# Patient Record
Sex: Female | Born: 1995 | Hispanic: Yes | Smoking: Never smoker
Health system: Southern US, Community
[De-identification: ages and names within clinical notes are randomized; demographics above are authoritative.]

## PROBLEM LIST (undated history)

## (undated) ENCOUNTER — Inpatient Hospital Stay (HOSPITAL_COMMUNITY): Payer: Self-pay

## (undated) DIAGNOSIS — K831 Obstruction of bile duct: Secondary | ICD-10-CM

## (undated) DIAGNOSIS — O26619 Liver and biliary tract disorders in pregnancy, unspecified trimester: Secondary | ICD-10-CM

## (undated) DIAGNOSIS — B999 Unspecified infectious disease: Secondary | ICD-10-CM

## (undated) DIAGNOSIS — O24419 Gestational diabetes mellitus in pregnancy, unspecified control: Secondary | ICD-10-CM

## (undated) HISTORY — PX: NO PAST SURGERIES: SHX2092

## (undated) HISTORY — DX: Gestational diabetes mellitus in pregnancy, unspecified control: O24.419

---
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
## 2017-03-16 ENCOUNTER — Other Ambulatory Visit: Payer: Self-pay

## 2017-03-16 ENCOUNTER — Encounter (HOSPITAL_COMMUNITY): Payer: Self-pay | Admitting: Emergency Medicine

## 2017-03-16 ENCOUNTER — Emergency Department (HOSPITAL_COMMUNITY)
Admission: EM | Admit: 2017-03-16 | Discharge: 2017-03-16 | Disposition: A | Discharge status: Home / Self Care | Payer: Self-pay | Attending: Emergency Medicine | Admitting: Emergency Medicine

## 2017-03-16 DIAGNOSIS — N760 Acute vaginitis: Secondary | ICD-10-CM | POA: Insufficient documentation

## 2017-03-16 DIAGNOSIS — Z711 Person with feared health complaint in whom no diagnosis is made: Secondary | ICD-10-CM | POA: Insufficient documentation

## 2017-03-16 DIAGNOSIS — B9689 Other specified bacterial agents as the cause of diseases classified elsewhere: Secondary | ICD-10-CM | POA: Insufficient documentation

## 2017-03-16 DIAGNOSIS — R300 Dysuria: Secondary | ICD-10-CM | POA: Insufficient documentation

## 2017-03-16 DIAGNOSIS — Z87891 Personal history of nicotine dependence: Secondary | ICD-10-CM | POA: Insufficient documentation

## 2017-03-16 LAB — WET PREP, GENITAL
Sperm: NONE SEEN
Trich, Wet Prep: NONE SEEN
Yeast Wet Prep HPF POC: NONE SEEN

## 2017-03-16 LAB — URINALYSIS, ROUTINE W REFLEX MICROSCOPIC
Bilirubin Urine: NEGATIVE
GLUCOSE, UA: NEGATIVE mg/dL
KETONES UR: NEGATIVE mg/dL
Nitrite: NEGATIVE
PH: 7 (ref 5.0–8.0)
PROTEIN: 30 mg/dL — AB
Specific Gravity, Urine: 1.014 (ref 1.005–1.030)

## 2017-03-16 LAB — GC/CHLAMYDIA PROBE AMP (~~LOC~~) NOT AT ARMC
Chlamydia: NEGATIVE
NEISSERIA GONORRHEA: NEGATIVE

## 2017-03-16 MED ORDER — LIDOCAINE HCL (PF) 1 % IJ SOLN
INTRAMUSCULAR | Status: AC
  Filled 2017-03-16: qty 5

## 2017-03-16 MED ORDER — ONDANSETRON 4 MG PO TBDP
4.0000 mg | ORAL_TABLET | Freq: Once | ORAL | Status: AC
Start: 2017-03-16 — End: 2017-03-16
  Administered 2017-03-16: 4 mg via ORAL
  Filled 2017-03-16: qty 1

## 2017-03-16 MED ORDER — CEFTRIAXONE SODIUM 250 MG IJ SOLR
250.0000 mg | Freq: Once | INTRAMUSCULAR | Status: AC
  Administered 2017-03-16: 250 mg via INTRAMUSCULAR
  Filled 2017-03-16: qty 250

## 2017-03-16 MED ORDER — METRONIDAZOLE 500 MG PO TABS: 500.0000 mg | ORAL_TABLET | Freq: Two times a day (BID) | ORAL | 0 refills | Status: DC

## 2017-03-16 MED ORDER — CEPHALEXIN 500 MG PO CAPS: 500.0000 mg | ORAL_CAPSULE | Freq: Four times a day (QID) | ORAL | 0 refills | Status: DC

## 2017-03-16 MED ORDER — AZITHROMYCIN 250 MG PO TABS
1000.0000 mg | ORAL_TABLET | Freq: Once | ORAL | Status: AC
  Administered 2017-03-16: 1000 mg via ORAL
  Filled 2017-03-16: qty 4

## 2017-03-16 NOTE — ED Notes (Signed)
Pelvic cart set up at bedside  

## 2017-03-16 NOTE — ED Provider Notes (Signed)
[facility] EMERGENCY DEPARTMENT Provider Note   CSN: [id number] Arrival date & time: 03/16/17  0123     History   Chief Complaint Chief Complaint  Patient presents with  . Vaginal Itching    HPI Colleen Rose is a 22 y.o. female with a hx of gestational diabetes, cholestasis of pregnancy presents to the Emergency Department complaining of gradual, persistent, progressively worsening dysuria and vaginal burning onset yesterday after sexual intercourse.  Patient reports she was sexually active with her boyfriend which caused some pain but no bleeding.  She reports that afterwards she had some vaginal irritation but has had increased dysuria since that time.  She reports late last night, she developed low back pain and lower abdominal cramping.  Patient reports she is sexually active with one female partner.  She is not on birth control and is not using a condom.  She reports last menstrual cycle was at the end of January.  Nothing seems to make her symptoms better or worse.  No treatments prior to arrival.  She denies fever, chills, vomiting, diarrhea, weakness, dizziness, syncope.  Patient does endorse some associated vaginal discharge.  The history is provided by the patient and medical records. No language interpreter was used.    Past Medical History:  Diagnosis Date  . Gestational diabetes    diet controlled  . Medical history non-contributory     Patient Active Problem List   Diagnosis Date Noted  . Cholestasis of pregnancy 04/05/2015  . Pruritus of pregnancy 04/04/2015  . Maternal varicella, non-immune 03/25/2015  . Anemia affecting pregnancy 03/25/2015  . Supervision of high risk pregnancy, antepartum 03/08/2015  . High risk teen pregnancy 03/08/2015  . Gestational diabetes 03/08/2015    Past Surgical History:  Procedure Laterality Date  . NO PAST SURGERIES      OB History    Gravida Para Term Preterm AB Living   1 1 1     1    SAB TAB  Ectopic Multiple Live Births         0 1       Home Medications    Prior to Admission medications   Medication Sig Start Date End Date Taking? Authorizing Provider  cephALEXin (KEFLEX) 500 MG capsule Take 1 capsule (500 mg total) by mouth 4 (four) times daily. 03/16/17   Xiadani Damman, Dahlia Client, PA-C  ibuprofen (ADVIL,MOTRIN) 600 MG tablet Take 1 tablet (600 mg total) by mouth every 6 (six) hours. Patient not taking: Reported on 03/16/2017 04/08/15   Montez Morita, CNM  metroNIDAZOLE (FLAGYL) 500 MG tablet Take 1 tablet (500 mg total) by mouth 2 (two) times daily. 03/16/17   Debi Cousin, Dahlia Client, PA-C    Family History Family History  Problem Relation Age of Onset  . Hypertension Mother   . Diabetes Mother   . Hypertension Father   . Diabetes Father     Social History Social History   Tobacco Use  . Smoking status: Former Games developer  . Smokeless tobacco: Never Used  Substance Use Topics  . Alcohol use: No  . Drug use: Yes    Types: Marijuana    Comment:      before pregnancy      Allergies   Other   Review of Systems Review of Systems  Constitutional: Negative for appetite change, diaphoresis, fatigue, fever and unexpected weight change.  HENT: Negative for mouth sores.   Eyes: Negative for visual disturbance.  Respiratory: Negative for cough, chest tightness, shortness of breath  and wheezing.   Cardiovascular: Negative for chest pain.  Gastrointestinal: Positive for abdominal pain ( low). Negative for constipation, diarrhea, nausea and vomiting.  Endocrine: Negative for polydipsia, polyphagia and polyuria.  Genitourinary: Positive for dysuria, vaginal discharge and vaginal pain. Negative for frequency, hematuria and urgency.  Musculoskeletal: Positive for back pain ( low). Negative for neck stiffness.  Skin: Negative for rash.  Allergic/Immunologic: Negative for immunocompromised state.  Neurological: Negative for syncope, light-headedness and headaches.    Hematological: Does not bruise/bleed easily.  Psychiatric/Behavioral: Negative for sleep disturbance. The patient is not nervous/anxious.      Physical Exam Updated Vital Signs BP 122/79 (BP Location: Right Arm)   Pulse 91   Temp 98.4 F (36.9 C) (Oral)   Resp 18   Ht 5\' 1"  (1.549 m)   Wt 57.6 kg (127 lb)   LMP 03/03/2017   SpO2 100%   BMI 24.00 kg/m   Physical Exam  Constitutional: She appears well-developed and well-nourished. No distress.  HENT:  Head: Normocephalic and atraumatic.  Eyes: Conjunctivae are normal.  Neck: Normal range of motion.  Cardiovascular: Normal rate, regular rhythm, normal heart sounds and intact distal pulses.  No murmur heard. Pulmonary/Chest: Effort normal and breath sounds normal. No respiratory distress. She has no wheezes.  Abdominal: Soft. Bowel sounds are normal. There is tenderness in the suprapubic area. There is no rigidity, no rebound, no guarding and no CVA tenderness. Hernia confirmed negative in the right inguinal area and confirmed negative in the left inguinal area.  No CVA tenderness.  Genitourinary: Uterus normal. No labial fusion. There is no rash, tenderness or lesion on the right labia. There is no rash, tenderness or lesion on the left labia. Uterus is not deviated, not enlarged, not fixed and not tender. Cervix exhibits no motion tenderness, no discharge and no friability. Right adnexum displays no mass, no tenderness and no fullness. Left adnexum displays no mass, no tenderness and no fullness. No erythema, tenderness or bleeding in the vagina. No foreign body in the vagina. No signs of injury around the vagina. Vaginal discharge (thin, green, frothy) found.  Musculoskeletal: Normal range of motion. She exhibits no edema.  Lymphadenopathy:       Right: No inguinal adenopathy present.       Left: No inguinal adenopathy present.  Neurological: She is alert.  Skin: Skin is warm and dry. She is not diaphoretic. No erythema.   Psychiatric: She has a normal mood and affect.  Nursing note and vitals reviewed.    ED Treatments / Results  Labs (all labs ordered are listed, but only abnormal results are displayed) Labs Reviewed  WET PREP, GENITAL - Abnormal; Notable for the following components:      Result Value   Clue Cells Wet Prep HPF POC PRESENT (*)    WBC, Wet Prep HPF POC MANY (*)    All other components within normal limits  URINALYSIS, ROUTINE W REFLEX MICROSCOPIC - Abnormal; Notable for the following components:   APPearance HAZY (*)    Hgb urine dipstick MODERATE (*)    Protein, ur 30 (*)    Leukocytes, UA LARGE (*)    Bacteria, UA RARE (*)    Squamous Epithelial / LPF 0-5 (*)    All other components within normal limits  PREGNANCY, URINE  GC/CHLAMYDIA PROBE AMP ([place]) NOT AT [facility]    Procedures Procedures (including critical care time)  Medications Ordered in ED Medications  lidocaine (PF) (XYLOCAINE) 1 % injection (not administered)  cefTRIAXone (ROCEPHIN) injection 250 mg (250 mg Intramuscular Given 03/16/17 0723)  azithromycin (ZITHROMAX) tablet 1,000 mg (1,000 mg Oral Given 03/16/17 0723)  ondansetron (ZOFRAN-ODT) disintegrating tablet 4 mg (4 mg Oral Given 03/16/17 0723)     Initial Impression / Assessment and Plan / ED Course  I have reviewed the triage vital signs and the nursing notes.  Pertinent labs & imaging results that were available during my care of the patient were reviewed by me and considered in my medical decision making (see chart for details).  Clinical Course as of Mar 17 731  Tue Mar 16, 2017  0648 Also TNTC WBCs.  Pt with dysuria, will treat for possible UTI. Leukocytes, UA: (!) LARGE [HM]    Clinical Course User Index [HM] Susanna Benge, Boyd KerbsHannah, PA-C    Are ordered on patient presents with dysuria and vaginal irritation after sexual encounter with her boyfriend.  On pelvic exam, vaginal discharge is noted but no cervical motion or adnexal tenderness.   No evidence of PID.  Urinalysis with too numerous to count white blood cells and leukocytes.  Concern for possible urinary tract infection versus STD.  Patient treated with azithromycin and Rocephin.  Will be discharged home with prescription for Flagyl for bacterial vaginosis and Keflex for UTI.  Discussed the need to inform all partners if positive.  Patient will also need OB/GYN follow-up.  Patient states understanding and is in agreement with this plan.  Patient  Final Clinical Impressions(s) / ED Diagnoses   Final diagnoses:  BV (bacterial vaginosis)  Concern about STD in female without diagnosis  Dysuria    ED Discharge Orders        Ordered    metroNIDAZOLE (FLAGYL) 500 MG tablet  2 times daily     03/16/17 0728    cephALEXin (KEFLEX) 500 MG capsule  4 times daily     03/16/17 0728       Raylynn Hersh, Dahlia ClientHannah, PA-C 03/16/17 0737    Horton, Mayer Maskerourtney F, MD 03/16/17 [phone]

## 2017-03-16 NOTE — ED Triage Notes (Signed)
Pt states she is having burning sensation with urination and vaginal itchiness since yesterday. No fever or chills.

## 2017-03-16 NOTE — ED Notes (Signed)
Patient given Sprite and Graham crackers. 

## 2017-03-16 NOTE — Discharge Instructions (Signed)
1. Medications: Flagyl, Keflex, usual home medications 2. Treatment: rest, drink plenty of fluids, use a condom with every sexual encounter 3. Follow Up: Please followup with your primary doctor in 3 days for discussion of your diagnoses and further evaluation after today's visit; if you do not have a primary care doctor use the resource guide provided to find one; Please return to the ER for worsening symptoms, high fevers or persistent vomiting.  You have been tested for chlamydia and gonorrhea.  These results will be available in approximately 3 days.  Please inform all sexual partners if you test positive for any of these diseases.

## 2017-11-15 ENCOUNTER — Encounter: Payer: Self-pay | Admitting: Family Medicine

## 2017-11-15 ENCOUNTER — Ambulatory Visit (INDEPENDENT_AMBULATORY_CARE_PROVIDER_SITE_OTHER): Payer: Self-pay | Admitting: *Deleted

## 2017-11-15 ENCOUNTER — Encounter: Payer: Self-pay | Admitting: *Deleted

## 2017-11-15 VITALS — BP 125/71 | HR 102 | Wt 147.9 lb

## 2017-11-15 DIAGNOSIS — Z3201 Encounter for pregnancy test, result positive: Secondary | ICD-10-CM

## 2017-11-15 LAB — POCT PREGNANCY, URINE: PREG TEST UR: POSITIVE — AB

## 2017-11-15 NOTE — Progress Notes (Signed)
Agree with RN documentation and plan of care.  Sueko Dimichele S. Ashur Glatfelter, DO OB/GYN Fellow  

## 2017-11-15 NOTE — Progress Notes (Addendum)
Here to confirm pregnancy. Had positive upt at home. LMP 10/15/17. Denies any pain or bleeding presently. Just wanted to confirm pregnancy. Advised to start taking PNV. Currently takes Melatonin and Benadryl for sleep. Advised not to take Melatonin during first trimester and may take Tylenol for pain as needed. Will give pregnancy verification letter at check out. Advised to make appt to begin [facility] with provider of choice

## 2017-11-21 ENCOUNTER — Emergency Department (HOSPITAL_COMMUNITY)
Admission: EM | Admit: 2017-11-21 | Discharge: 2017-11-21 | Disposition: A | Discharge status: Home / Self Care | Payer: Self-pay | Attending: Emergency Medicine | Admitting: Emergency Medicine

## 2017-11-21 ENCOUNTER — Emergency Department (HOSPITAL_COMMUNITY): Payer: Self-pay

## 2017-11-21 ENCOUNTER — Encounter (HOSPITAL_COMMUNITY): Payer: Self-pay | Admitting: Emergency Medicine

## 2017-11-21 DIAGNOSIS — Z79899 Other long term (current) drug therapy: Secondary | ICD-10-CM | POA: Insufficient documentation

## 2017-11-21 DIAGNOSIS — Z3A00 Weeks of gestation of pregnancy not specified: Secondary | ICD-10-CM | POA: Insufficient documentation

## 2017-11-21 DIAGNOSIS — O4690 Antepartum hemorrhage, unspecified, unspecified trimester: Secondary | ICD-10-CM

## 2017-11-21 DIAGNOSIS — Z87891 Personal history of nicotine dependence: Secondary | ICD-10-CM | POA: Insufficient documentation

## 2017-11-21 DIAGNOSIS — O200 Threatened abortion: Secondary | ICD-10-CM | POA: Insufficient documentation

## 2017-11-21 LAB — BASIC METABOLIC PANEL
ANION GAP: 9 (ref 5–15)
BUN: 10 mg/dL (ref 6–20)
CHLORIDE: 108 mmol/L (ref 98–111)
CO2: 20 mmol/L — ABNORMAL LOW (ref 22–32)
Calcium: 9.1 mg/dL (ref 8.9–10.3)
Creatinine, Ser: 0.48 mg/dL (ref 0.44–1.00)
GFR calc Af Amer: >60 mL/min (ref >60)
GLUCOSE: 92 mg/dL (ref 70–99)
POTASSIUM: 4.5 mmol/L (ref 3.5–5.1)
SODIUM: 137 mmol/L (ref 135–145)

## 2017-11-21 LAB — CBC WITH DIFFERENTIAL/PLATELET
Abs Immature Granulocytes: 0.07 10*3/uL (ref 0.00–0.07)
BASOS ABS: 0.1 10*3/uL (ref 0.0–0.1)
Basophils Relative: 0 %
EOS ABS: 0.1 10*3/uL (ref 0.0–0.5)
EOS PCT: 1 %
HEMATOCRIT: 41.4 % (ref 36.0–46.0)
HEMOGLOBIN: 13.3 g/dL (ref 12.0–15.0)
Immature Granulocytes: 1 %
Lymphocytes Relative: 27 %
Lymphs Abs: 3.7 10*3/uL (ref 0.7–4.0)
MCH: 27.1 pg (ref 26.0–34.0)
MCHC: 32.1 g/dL (ref 30.0–36.0)
MCV: 84.5 fL (ref 80.0–100.0)
MONO ABS: 0.8 10*3/uL (ref 0.1–1.0)
Monocytes Relative: 5 %
NRBC: 0 % (ref 0.0–0.2)
Neutro Abs: 9.3 10*3/uL — ABNORMAL HIGH (ref 1.7–7.7)
Neutrophils Relative %: 66 %
Platelets: 462 10*3/uL — ABNORMAL HIGH (ref 150–400)
RBC: 4.9 MIL/uL (ref 3.87–5.11)
RDW: 12.3 % (ref 11.5–15.5)
WBC: 14.1 10*3/uL — ABNORMAL HIGH (ref 4.0–10.5)

## 2017-11-21 LAB — URINALYSIS, ROUTINE W REFLEX MICROSCOPIC
BILIRUBIN URINE: NEGATIVE
Glucose, UA: NEGATIVE mg/dL
KETONES UR: NEGATIVE mg/dL
NITRITE: NEGATIVE
PROTEIN: NEGATIVE mg/dL
Specific Gravity, Urine: 1.014 (ref 1.005–1.030)
pH: 6 (ref 5.0–8.0)

## 2017-11-21 LAB — WET PREP, GENITAL
SPERM: NONE SEEN
Trich, Wet Prep: NONE SEEN
YEAST WET PREP: NONE SEEN

## 2017-11-21 LAB — HCG, QUANTITATIVE, PREGNANCY: hCG, Beta Chain, Quant, S: 1580 m[IU]/mL — ABNORMAL HIGH (ref <5)

## 2017-11-21 NOTE — Discharge Instructions (Addendum)
Please have a repeat quantitative HCG blood test done in 2 days. This can be done at [facility] or in the [facility] Emergency Room.  Go to the emergency department at [facility] should you begin to have fever, increased pain, passing out, increased bleeding, or any other major concerns.

## 2017-11-21 NOTE — ED Provider Notes (Signed)
[facility] EMERGENCY DEPARTMENT Provider Note   CSN: [id number] Arrival date & time: 11/21/17  1443     History   Chief Complaint Chief Complaint  Patient presents with  . Vaginal Bleeding  . Threatened Miscarriage    HPI Colleen Rose is a 22 y.o. female.  HPI   Colleen Rose is a 22 y.o. female, with a history of G2P1, presenting to the ED with vaginal bleeding in setting of pregnancy. States she is [redacted] weeks pregnant.  She has irregular periods and this gestational age was estimated based on the time she last had vaginal bleeding that could have been her period, which occurred in October 15, 2017. Bleeding began with spotting yesterday.  It has increased to the point to where she states it is heavy enough to be her period.  She has used 2 pads in the last 24 hours. She also complains of intermittent lower abdominal and left lower quadrant cramping beginning yesterday.  Currently pain is 6/10, nonradiating. Patient states she was seen by [facility] on 10/14 and had a positive pregnancy test at that time.  Her next prenatal follow-up appointment is in December.  Denies fever/chills, nausea/vomiting, dysuria, back pain, or any other complaints.   Past Medical History:  Diagnosis Date  . Gestational diabetes    diet controlled  . Medical history non-contributory     Patient Active Problem List   Diagnosis Date Noted  . Cholestasis of pregnancy 04/05/2015  . Pruritus of pregnancy 04/04/2015  . Maternal varicella, non-immune 03/25/2015  . Anemia affecting pregnancy 03/25/2015  . Supervision of high risk pregnancy, antepartum 03/08/2015  . High risk teen pregnancy 03/08/2015  . Gestational diabetes 03/08/2015    Past Surgical History:  Procedure Laterality Date  . NO PAST SURGERIES       OB History    Gravida  2   Para  1   Term  1   Preterm      AB      Living  1     SAB      TAB      Ectopic        Multiple  0   Live Births  1            Home Medications    Prior to Admission medications   Medication Sig Start Date End Date Taking? Authorizing Provider  diphenhydrAMINE (BENADRYL) 25 MG tablet Take 25 mg by mouth every 6 (six) hours as needed.    [provider]  Melatonin 2.5 MG CHEW Chew by mouth.    [provider]    Family History Family History  Problem Relation Age of Onset  . Hypertension Mother   . Diabetes Mother   . Hypertension Father   . Diabetes Father     Social History Social History   Tobacco Use  . Smoking status: Former Games developer  . Smokeless tobacco: Never Used  Substance Use Topics  . Alcohol use: No  . Drug use: Yes    Types: Marijuana    Comment:      before pregnancy      Allergies   Other   Review of Systems Review of Systems  Constitutional: Negative for chills, diaphoresis and fever.  Respiratory: Negative for shortness of breath.   Cardiovascular: Negative for chest pain.  Gastrointestinal: Positive for abdominal pain. Negative for blood in stool, nausea and vomiting.  Genitourinary: Positive for vaginal bleeding. Negative for dysuria  and flank pain.  Musculoskeletal: Negative for back pain.  Neurological: Negative for dizziness, weakness and light-headedness.  All other systems reviewed and are negative.    Physical Exam Updated Vital Signs BP 117/67   Pulse 85   Temp 99.1 F (37.3 C)   Resp 18   LMP 10/15/2017   SpO2 100%   Physical Exam  Constitutional: She appears well-developed and well-nourished. No distress.  HENT:  Head: Normocephalic and atraumatic.  Eyes: Conjunctivae are normal.  Neck: Neck supple.  Cardiovascular: Normal rate, regular rhythm and intact distal pulses.  Pulmonary/Chest: Effort normal and breath sounds normal. No respiratory distress.  Abdominal: Soft. There is tenderness. There is no guarding.    Patient indicates tenderness verbally only and has no other  reaction.  Genitourinary:  Genitourinary Comments: External genitalia normal Vagina with discharge - Blood and mucus in the vaginal vault and appearing to originate from the cervical os. Cervix  abnormal - Cervical os appears to be open negative for cervical motion tenderness Adnexa palpated, no masses, positive for tenderness noted bilaterally Bladder palpated negative for tenderness Uterus palpated no masses, positive for tenderness  No inguinal lymphadenopathy. Otherwise normal female genitalia. RN, Albin Felling, served as chaperone during exam.  What appears to be tissue came out of patient's vagina.  She states she found it in her underwear while here in the ED. Its appearance would fit with products of conception. This was collected and sent to pathology.  Musculoskeletal: She exhibits no edema.  Lymphadenopathy:    She has no cervical adenopathy.  Neurological: She is alert.  Skin: Skin is warm and dry. She is not diaphoretic.  Psychiatric: She has a normal mood and affect. Her behavior is normal.  Nursing note and vitals reviewed.    ED Treatments / Results  Labs (all labs ordered are listed, but only abnormal results are displayed) Labs Reviewed  WET PREP, GENITAL - Abnormal; Notable for the following components:      Result Value   Clue Cells Wet Prep HPF POC PRESENT (*)    WBC, Wet Prep HPF POC FEW (*)    All other components within normal limits  CBC WITH DIFFERENTIAL/PLATELET - Abnormal; Notable for the following components:   WBC 14.1 (*)    Platelets 462 (*)    Neutro Abs 9.3 (*)    All other components within normal limits  HCG, QUANTITATIVE, PREGNANCY - Abnormal; Notable for the following components:   hCG, Beta Chain, Quant, S 1,580 (*)    All other components within normal limits  URINALYSIS, ROUTINE W REFLEX MICROSCOPIC - Abnormal; Notable for the following components:   Hgb urine dipstick LARGE (*)    Leukocytes, UA TRACE (*)    Bacteria, UA RARE (*)    All  other components within normal limits  BASIC METABOLIC PANEL - Abnormal; Notable for the following components:   CO2 20 (*)    All other components within normal limits  GC/CHLAMYDIA PROBE AMP ([place]) NOT AT [facility]  SURGICAL PATHOLOGY    EKG None  Radiology US Ob Comp < 14 Wks  Result Date: 11/21/2017 CLINICAL DATA:  Lower abdominal pain and vaginal bleeding. Elevated beta HCG. EXAM: OBSTETRIC <14 WK [place] AND TRANSVAGINAL OB US TECHNIQUE: Both transabdominal and transvaginal ultrasound examinations were performed for complete evaluation of the gestation as well as the maternal uterus, adnexal regions, and pelvic cul-de-sac. Transvaginal technique was performed to assess early pregnancy. COMPARISON:  Obstetric ultrasound 03/27/2015 FINDINGS: Intrauterine gestational sac: None  Yolk sac:  Not Visualized. Embryo:  Not Visualized. Cardiac Activity: Not Visualized. Maternal uterus/adnexae: Normal appearance of the ovaries. IMPRESSION: No evidence of intrauterine pregnancy. Clinical follow-up is recommended. Electronically Signed   By: Ted Mcalpine M.D.   On: 11/21/2017 18:39   US Ob Transvaginal  Result Date: 11/21/2017 CLINICAL DATA:  Lower abdominal pain and vaginal bleeding. Elevated beta HCG. EXAM: OBSTETRIC <14 WK [place] AND TRANSVAGINAL OB US TECHNIQUE: Both transabdominal and transvaginal ultrasound examinations were performed for complete evaluation of the gestation as well as the maternal uterus, adnexal regions, and pelvic cul-de-sac. Transvaginal technique was performed to assess early pregnancy. COMPARISON:  Obstetric ultrasound 03/27/2015 FINDINGS: Intrauterine gestational sac: None Yolk sac:  Not Visualized. Embryo:  Not Visualized. Cardiac Activity: Not Visualized. Maternal uterus/adnexae: Normal appearance of the ovaries. IMPRESSION: No evidence of intrauterine pregnancy. Clinical follow-up is recommended. Electronically Signed   By: Ted Mcalpine M.D.   On: 11/21/2017 18:39      Procedures Pelvic exam Date/Time: 11/21/2017 5:11 PM Performed by: Anselm Pancoast, PA-C Authorized by: Anselm Pancoast, PA-C  Consent: Verbal consent obtained. Risks and benefits: risks, benefits and alternatives were discussed Consent given by: patient Patient identity confirmed: verbally with patient and provided demographic data Local anesthesia used: no  Anesthesia: Local anesthesia used: no  Sedation: Patient sedated: no  Patient tolerance: Patient tolerated the procedure well with no immediate complications    (including critical care time)  Medications Ordered in ED Medications - No data to display   Initial Impression / Assessment and Plan / ED Course  I have reviewed the triage vital signs and the nursing notes.  Pertinent labs & imaging results that were available during my care of the patient were reviewed by me and considered in my medical decision making (see chart for details).     Patient presents with vaginal bleeding in the setting of pregnancy.  Patient is nontoxic appearing, afebrile, not tachycardic, not tachypneic, not hypotensive, maintains excellent SPO2 on room air, and is in no apparent distress. Concern for spontaneous miscarriage based on bleeding, cramping, suspected open cervical os, and presence of products of conception.  Patient was informed of this possibility.  She will follow-up with OB/GYN in 2 days for repeat testing.  Return precautions discussed.  Patient voices understanding of these instructions, accepts the plan, and is comfortable with discharge.    Vitals:   11/21/17 1845 11/21/17 1915 11/21/17 1930 11/21/17 1945  BP: (!) 95/53 (!) 110/55 103/67 117/66  Pulse: 96 86 93 98  Resp:      Temp:      TempSrc:      SpO2: 100% 100% 100% 100%     Final Clinical Impressions(s) / ED Diagnoses   Final diagnoses:  Vaginal bleeding in pregnancy    ED Discharge Orders    None       Concepcion Living 11/21/17 2021     Gwyneth Sprout, MD 11/21/17 2116

## 2017-11-21 NOTE — ED Notes (Signed)
[redacted] weeks pregnant.  G2P1, 1st pregnancy full term, vaginal delivery, no complications.  Onset yesterday vaginal bleeding, increased today, Changed one pad today that was 1/3 saturated, bright red blood.  Mild abd cramping, 3/10 pain scale.  Last intercourse 3-4 weeks ago.

## 2017-11-21 NOTE — ED Triage Notes (Signed)
Pt states she is [redacted] weeks pregnant. She has had spotting since yesterday but today the spotting increased.

## 2017-11-21 NOTE — ED Notes (Signed)
Pt called this nurse to [facility], pt passed what appears to be tissue.  PA made aware.

## 2017-11-22 LAB — GC/CHLAMYDIA PROBE AMP (~~LOC~~) NOT AT ARMC
Chlamydia: NEGATIVE
NEISSERIA GONORRHEA: NEGATIVE

## 2017-11-23 ENCOUNTER — Telehealth: Payer: Self-pay | Admitting: Family Medicine

## 2017-11-23 ENCOUNTER — Inpatient Hospital Stay (HOSPITAL_COMMUNITY)
Admission: AD | Admit: 2017-11-23 | Discharge: 2017-11-23 | Disposition: A | Discharge status: Home / Self Care | Payer: Self-pay | Source: Ambulatory Visit | Attending: Obstetrics and Gynecology | Admitting: Obstetrics and Gynecology

## 2017-11-23 ENCOUNTER — Other Ambulatory Visit: Payer: Self-pay

## 2017-11-23 ENCOUNTER — Encounter (HOSPITAL_COMMUNITY): Payer: Self-pay | Admitting: *Deleted

## 2017-11-23 DIAGNOSIS — O039 Complete or unspecified spontaneous abortion without complication: Secondary | ICD-10-CM

## 2017-11-23 DIAGNOSIS — O209 Hemorrhage in early pregnancy, unspecified: Secondary | ICD-10-CM | POA: Insufficient documentation

## 2017-11-23 HISTORY — DX: Liver and biliary tract disorders in pregnancy, unspecified trimester: O26.619

## 2017-11-23 HISTORY — DX: Unspecified infectious disease: B99.9

## 2017-11-23 HISTORY — DX: Obstruction of bile duct: K83.1

## 2017-11-23 LAB — CBC
HEMATOCRIT: 40.3 % (ref 36.0–46.0)
HEMOGLOBIN: 13.6 g/dL (ref 12.0–15.0)
MCH: 28.3 pg (ref 26.0–34.0)
MCHC: 33.7 g/dL (ref 30.0–36.0)
MCV: 84 fL (ref 80.0–100.0)
Platelets: 389 10*3/uL (ref 150–400)
RBC: 4.8 MIL/uL (ref 3.87–5.11)
RDW: 12.9 % (ref 11.5–15.5)
WBC: 11.2 10*3/uL — ABNORMAL HIGH (ref 4.0–10.5)

## 2017-11-23 LAB — HCG, QUANTITATIVE, PREGNANCY: hCG, Beta Chain, Quant, S: 224 m[IU]/mL — ABNORMAL HIGH (ref <5)

## 2017-11-23 NOTE — Discharge Instructions (Signed)

## 2017-11-23 NOTE — Telephone Encounter (Signed)
Patient called to say she was bleeding. Was instructed to go to MAU for evaluation.

## 2017-11-23 NOTE — MAU Provider Note (Signed)
History     CSN: [id number]  Arrival date and time: 11/23/17 4098   First Provider Initiated Contact with Patient 11/23/17 1101      Chief Complaint  Patient presents with  . Follow-up   HPI  Colleen Rose is a 22 y.o. G2P1001 who is approximately [redacted] weeks pregnant by LMP. She presents to MAU with chief complaint of vaginal spotting and mild abdominal cramping. She is s/p triage in ED 11/21/17. States her symptoms have improved greatly since that visit but she was instructed to follow-up with an OB for management of residual bleeding and cramping.  Patient endorses positive pregnancy test and LMP of 10/15/2017. Denies heavy vaginal bleeding,abdnormal vaginal discharge, sexual intercourse, fever, falls, or recent illness.    OB History    Gravida  2   Para  1   Term  1   Preterm      AB      Living  1     SAB      TAB      Ectopic      Multiple  0   Live Births  1           Past Medical History:  Diagnosis Date  . Cholestasis of pregnancy 2017  . Gestational diabetes    diet controlled  . Infection    UTI    Past Surgical History:  Procedure Laterality Date  . NO PAST SURGERIES      Family History  Problem Relation Age of Onset  . Hypertension Mother   . Diabetes Mother   . Hypertension Father   . Diabetes Father     Social History   Tobacco Use  . Smoking status: Never Smoker  . Smokeless tobacco: Never Used  Substance Use Topics  . Alcohol use: No  . Drug use: Not Currently    Types: Marijuana    Comment: 2015    Allergies:  Allergies  Allergen Reactions  . Other Anaphylaxis    Peaches    No medications prior to admission.    Review of Systems  Constitutional: Negative for chills, fatigue and fever.  Respiratory: Negative for shortness of breath.   Gastrointestinal: Positive for abdominal pain.       Patient states abdominal cramping is "mild and almost gone"  Genitourinary: Positive for vaginal bleeding.  Negative for difficulty urinating, dyspareunia and flank pain.  Neurological: Negative for headaches.  All other systems reviewed and are negative.  Physical Exam   Blood pressure (!) 113/49, pulse 92, temperature 98.3 F (36.8 C), temperature source Oral, resp. rate 16, height 5\' 1"  (1.549 m), weight 67.6 kg, last menstrual period 10/15/2017, SpO2 100 %.  Physical Exam  Nursing note and vitals reviewed. Constitutional: She is oriented to person, place, and time. She appears well-developed and well-nourished.  Respiratory: Effort normal. No respiratory distress.  GI: Soft. She exhibits no distension. There is no tenderness. There is no rebound.  Neurological: She is alert and oriented to person, place, and time. She has normal reflexes.  Skin: Skin is warm and dry.  Psychiatric: She has a normal mood and affect. Her behavior is normal. Judgment and thought content normal.    MAU Course  Procedures  MDM  --Discussed with patient that she may have experienced early complete miscarriage. Explained desire to follow serial quants to ensure timely intervention in the event of complications --Discussed possible visit to [facility], LCSW for depression/grieving after pregnancy loss. Contact information provided  Patient  Vitals for the past 24 hrs:  BP Temp Temp src Pulse Resp SpO2 Height Weight  11/23/17 0944 (!) 113/49 98.3 F (36.8 C) Oral 92 16 100 % 5\' 1"  (1.549 m) 67.6 kg    Results for orders placed or performed during the hospital encounter of 11/23/17 (from the past 24 hour(s))  hCG, quantitative, pregnancy     Status: Abnormal   Collection Time: 11/23/17  9:59 AM  Result Value Ref Range   hCG, Beta Chain, Quant, S 224 (H) <5 mIU/mL  CBC     Status: Abnormal   Collection Time: 11/23/17  9:59 AM  Result Value Ref Range   WBC 11.2 (H) 4.0 - 10.5 K/uL   RBC 4.80 3.87 - 5.11 MIL/uL   Hemoglobin 13.6 12.0 - 15.0 g/dL   HCT 16.1 09.6 - 04.5 %   MCV 84.0 80.0 - 100.0 fL    MCH 28.3 26.0 - 34.0 pg   MCHC 33.7 30.0 - 36.0 g/dL   RDW 40.9 81.1 - 91.4 %   Platelets 389 150 - 400 K/uL    Assessment and Plan  --22 y.o. G2P1001 at Unknown  --Possible early miscarriage, hCG decreasing 1580-->224 in 48 hours --Bleeding precautions reviewed --Discharge home in stable condition  F/U: Patient has nurse visit for repeat Quant at [facility] 11/25/17 @ 0900  Calvert Cantor, CNM 11/23/2017, 11:57 AM

## 2017-11-23 NOTE — MAU Note (Signed)
Was seen at [facility], for spotting in preg.  Was told [facility] for [facility] test.  Was spotting yesterday.  Light in color today, but a little heavier. Mild cramps.

## 2017-11-25 ENCOUNTER — Ambulatory Visit (INDEPENDENT_AMBULATORY_CARE_PROVIDER_SITE_OTHER): Payer: Self-pay | Admitting: *Deleted

## 2017-11-25 DIAGNOSIS — O039 Complete or unspecified spontaneous abortion without complication: Secondary | ICD-10-CM

## 2017-11-25 LAB — HCG, QUANTITATIVE, PREGNANCY: HCG, BETA CHAIN, QUANT, S: 57 m[IU]/mL — AB (ref <5)

## 2017-11-25 NOTE — Progress Notes (Addendum)
Pt here for Stat BHCG.  She reports some light brownish vaginal bleeding. She is also having intermittent sharp pain in LUQ which occurs with increased physical activity. She has intermittent lower abdominal cramping which is also more frequent upon physical exertion and less while sedentary. Pt advised to wait for results - up to 2 hours. She voiced understanding.   1155  Test results reviewed by Dr. Erin Fulling and pt was informed the results are consistent with failed pregnancy. She will need a lab only visit in 2 weeks to be sure hormone level has returned to the non-pregnant level. She should return to hospital if bleeding becomes very heavy or severe pain. Pt voiced understanding of all information given.   Attestation of Attending Supervision of RN: Evaluation and management procedures were performed by the nurse under my supervision and collaboration.  I have reviewed the nursing note and chart, and I agree with the management and plan.  Carolyn L. Harraway-Smith, M.D., [facility]

## 2017-12-09 ENCOUNTER — Other Ambulatory Visit: Payer: Self-pay

## 2017-12-09 DIAGNOSIS — O039 Complete or unspecified spontaneous abortion without complication: Secondary | ICD-10-CM

## 2017-12-10 LAB — BETA HCG QUANT (REF LAB)

## 2018-01-10 ENCOUNTER — Encounter: Payer: Self-pay | Admitting: Obstetrics & Gynecology

## 2018-02-21 ENCOUNTER — Encounter: Payer: Self-pay | Admitting: Family Medicine

## 2018-02-21 ENCOUNTER — Ambulatory Visit: Payer: Self-pay | Admitting: *Deleted

## 2018-02-21 DIAGNOSIS — Z3201 Encounter for pregnancy test, result positive: Secondary | ICD-10-CM

## 2018-02-21 DIAGNOSIS — Z3200 Encounter for pregnancy test, result unknown: Secondary | ICD-10-CM

## 2018-02-21 LAB — POCT PREGNANCY, URINE: Preg Test, Ur: POSITIVE — AB

## 2018-02-21 NOTE — Progress Notes (Addendum)
Here for pregnancy test which was positive. States had home pregnancy test which was positive. States LMP was 12/24/17- this makes her [redacted]w[redacted]d with EDd 09/30/18. She states she had miscarriage before and is worried- denies any bleeding or pain. States has a little cramping at times. We discussed to go to mau for bleeding or severe pain ; but mild cramping can be normal. I advised her to start prenatal vitaimins and prenatal care .   Linda,RN

## 2018-02-21 NOTE — Progress Notes (Signed)
Patient seen and assessed by nursing staff.  Agree with documentation and plan.  

## 2018-03-07 ENCOUNTER — Encounter: Payer: Self-pay | Admitting: Obstetrics & Gynecology

## 2018-03-08 ENCOUNTER — Encounter: Payer: Self-pay | Admitting: Student

## 2018-03-08 ENCOUNTER — Ambulatory Visit (INDEPENDENT_AMBULATORY_CARE_PROVIDER_SITE_OTHER): Payer: Self-pay | Admitting: Student

## 2018-03-08 DIAGNOSIS — Z8632 Personal history of gestational diabetes: Secondary | ICD-10-CM

## 2018-03-08 DIAGNOSIS — O09292 Supervision of pregnancy with other poor reproductive or obstetric history, second trimester: Secondary | ICD-10-CM

## 2018-03-08 DIAGNOSIS — O0991 Supervision of high risk pregnancy, unspecified, first trimester: Secondary | ICD-10-CM

## 2018-03-08 DIAGNOSIS — O0990 Supervision of high risk pregnancy, unspecified, unspecified trimester: Secondary | ICD-10-CM

## 2018-03-08 NOTE — Progress Notes (Signed)
  Subjective:    Colleen Rose is being seen today for her first obstetrical visit.  This is not a planned pregnancy. She is at 1519w5d gestation. Her obstetrical history is significant for gestational diabetes, cholecystasis. . Relationship with FOB: significant other, not living together. Patient does intend to breast feed. Pregnancy history fully reviewed.  Patient reports little appetite but otherwise feels fine.   Review of Systems:   Review of Systems  Constitutional: Positive for appetite change.  HENT: Negative.   Respiratory: Negative.   Gastrointestinal: Negative.   Genitourinary: Negative.   Neurological: Negative.   Psychiatric/Behavioral: Negative.     Objective:     BP 126/70   Pulse 82   Wt 151 lb 12.8 oz (68.9 kg)   LMP 12/26/2017   Breastfeeding Unknown   BMI 28.68 kg/m  Physical Exam  Constitutional: She is oriented to person, place, and time. She appears well-developed.  HENT:  Head: Normocephalic.  Neck: Normal range of motion.  Respiratory: Effort normal.  GI: Soft.  Neurological: She is alert and oriented to person, place, and time.  Skin: Skin is warm.    Exam    Assessment:    Pregnancy: G3P1011 Patient Active Problem List   Diagnosis Date Noted  . Cholestasis of pregnancy 04/05/2015  . Pruritus of pregnancy 04/04/2015  . Maternal varicella, non-immune 03/25/2015  . Supervision of high risk pregnancy, antepartum 03/08/2015  . High risk teen pregnancy 03/08/2015       Plan:     Initial labs drawn. Prenatal vitamins. Problem list reviewed and updated. AFP3 discussed: will do at appropriate gestational age. Role of ultrasound in pregnancy discussed; fetal survey: order at next visit. Amniocentesis discussed: not indicated. Follow up in 4 weeks. 50% of 30 min visit spent on counseling and coordination of care.  Patient is familiar with practice, udnerstands role of midwives, residents, etc.  Patient wants tubal; I explained  that it was unlikely anyone would do this surgery but that IUD or Nexplanon or depo would be good options.  Has a history of gestational diabetes; will start baby ASA at 12 weeks and do HgA1c and early 1 hour Colleen Rose 03/08/2018

## 2018-03-08 NOTE — Patient Instructions (Signed)
First Trimester of Pregnancy  The first trimester of pregnancy is from week 1 until the end of week 13 (months 1 through 3). During this time, your baby will begin to develop inside you. At 6-8 weeks, the eyes and face are formed, and the heartbeat can be seen on ultrasound. At the end of 12 weeks, all the baby's organs are formed. Prenatal care is all the medical care you receive before the birth of your baby. Make sure you get good prenatal care and follow all of your doctor's instructions. Follow these instructions at home: Medicines  Take over-the-counter and prescription medicines only as told by your doctor. Some medicines are safe and some medicines are not safe during pregnancy.  Take a prenatal vitamin that contains at least 600 micrograms (mcg) of folic acid.  If you have trouble pooping (constipation), take medicine that will make your stool soft (stool softener) if your doctor approves. Eating and drinking   Eat regular, healthy meals.  Your doctor will tell you the amount of weight gain that is right for you.  Avoid raw meat and uncooked cheese.  If you feel sick to your stomach (nauseous) or throw up (vomit): ? Eat 4 or 5 small meals a day instead of 3 large meals. ? Try eating a few soda crackers. ? Drink liquids between meals instead of during meals.  To prevent constipation: ? Eat foods that are high in fiber, like fresh fruits and vegetables, whole grains, and beans. ? Drink enough fluids to keep your pee (urine) clear or pale yellow. Activity  Exercise only as told by your doctor. Stop exercising if you have cramps or pain in your lower belly (abdomen) or low back.  Do not exercise if it is too hot, too humid, or if you are in a place of great height (high altitude).  Try to avoid standing for long periods of time. Move your legs often if you must stand in one place for a long time.  Avoid heavy lifting.  Wear low-heeled shoes. Sit and stand up straight.   You can have sex unless your doctor tells you not to. Relieving pain and discomfort  Wear a good support bra if your breasts are sore.  Take warm water baths (sitz baths) to soothe pain or discomfort caused by hemorrhoids. Use hemorrhoid cream if your doctor says it is okay.  Rest with your legs raised if you have leg cramps or low back pain.  If you have puffy, bulging veins (varicose veins) in your legs: ? Wear support hose or compression stockings as told by your doctor. ? Raise (elevate) your feet for 15 minutes, 3-4 times a day. ? Limit salt in your food. Prenatal care  Schedule your prenatal visits by the twelfth week of pregnancy.  Write down your questions. Take them to your prenatal visits.  Keep all your prenatal visits as told by your doctor. This is important. Safety  Wear your seat belt at all times when driving.  Make a list of emergency phone numbers. The list should include numbers for family, friends, the hospital, and police and fire departments. General instructions  Ask your doctor for a referral to a local prenatal class. Begin classes no later than at the start of month 6 of your pregnancy.  Ask for help if you need counseling or if you need help with nutrition. Your doctor can give you advice or tell you where to go for help.  Do not use hot tubs, steam   rooms, or saunas.  Do not douche or use tampons or scented sanitary pads.  Do not cross your legs for long periods of time.  Avoid all herbs and alcohol. Avoid drugs that are not approved by your doctor.  Do not use any tobacco products, including cigarettes, chewing tobacco, and electronic cigarettes. If you need help quitting, ask your doctor. You may get counseling or other support to help you quit.  Avoid cat litter boxes and soil used by cats. These carry germs that can cause birth defects in the baby and can cause a loss of your baby (miscarriage) or stillbirth.  Visit your dentist. At home,  brush your teeth with a soft toothbrush. Be gentle when you floss. Contact a doctor if:  You are dizzy.  You have mild cramps or pressure in your lower belly.  You have a nagging pain in your belly area.  You continue to feel sick to your stomach, you throw up, or you have watery poop (diarrhea).  You have a bad smelling fluid coming from your vagina.  You have pain when you pee (urinate).  You have increased puffiness (swelling) in your face, hands, legs, or ankles. Get help right away if:  You have a fever.  You are leaking fluid from your vagina.  You have spotting or bleeding from your vagina.  You have very bad belly cramping or pain.  You gain or lose weight rapidly.  You throw up blood. It may look like coffee grounds.  You are around people who have German measles, fifth disease, or chickenpox.  You have a very bad headache.  You have shortness of breath.  You have any kind of trauma, such as from a fall or a car accident. Summary  The first trimester of pregnancy is from week 1 until the end of week 13 (months 1 through 3).  To take care of yourself and your unborn baby, you will need to eat healthy meals, take medicines only if your doctor tells you to do so, and do activities that are safe for you and your baby.  Keep all follow-up visits as told by your doctor. This is important as your doctor will have to ensure that your baby is healthy and growing well. This information is not intended to replace advice given to you by your health care provider. Make sure you discuss any questions you have with your health care provider. Document Released: 07/08/2007 Document Revised: 01/28/2016 Document Reviewed: 01/28/2016 Elsevier Interactive Patient Education  2019 [employer]  

## 2018-03-10 LAB — CULTURE, OB URINE

## 2018-03-10 LAB — OBSTETRIC PANEL, INCLUDING HIV
Antibody Screen: NEGATIVE
Basophils Absolute: 0 10*3/uL (ref 0.0–0.2)
Basos: 0 %
EOS (ABSOLUTE): 0 10*3/uL (ref 0.0–0.4)
EOS: 0 %
HEMOGLOBIN: 12.6 g/dL (ref 11.1–15.9)
HEP B S AG: NEGATIVE
HIV Screen 4th Generation wRfx: NONREACTIVE
Hematocrit: 37.5 % (ref 34.0–46.6)
IMMATURE GRANULOCYTES: 1 %
Immature Grans (Abs): 0.1 10*3/uL (ref 0.0–0.1)
LYMPHS: 11 %
Lymphocytes Absolute: 0.9 10*3/uL (ref 0.7–3.1)
MCH: 27.6 pg (ref 26.6–33.0)
MCHC: 33.6 g/dL (ref 31.5–35.7)
MCV: 82 fL (ref 79–97)
Monocytes Absolute: 0.5 10*3/uL (ref 0.1–0.9)
Monocytes: 5 %
NEUTROS PCT: 83 %
Neutrophils Absolute: 7.3 10*3/uL — ABNORMAL HIGH (ref 1.4–7.0)
Platelets: 358 10*3/uL (ref 150–450)
RBC: 4.57 x10E6/uL (ref 3.77–5.28)
RDW: 12.4 % (ref 11.7–15.4)
RH TYPE: POSITIVE
RPR Ser Ql: NONREACTIVE
Rubella Antibodies, IGG: 3.35 index (ref >0.99)
WBC: 8.8 10*3/uL (ref 3.4–10.8)

## 2018-03-10 LAB — HEMOGLOBINOPATHY EVALUATION
Ferritin: 102 ng/mL (ref 15–150)
HGB A2 QUANT: 2.4 % (ref 1.8–3.2)
HGB F QUANT: 0 % (ref 0.0–2.0)
Hgb A: 97.6 % (ref 96.4–98.8)
Hgb C: 0 %
Hgb S: 0 %
Hgb Solubility: NEGATIVE
Hgb Variant: 0 %

## 2018-03-10 LAB — HEMOGLOBIN A1C
Est. average glucose Bld gHb Est-mCnc: 105 mg/dL
HEMOGLOBIN A1C: 5.3 % (ref 4.8–5.6)

## 2018-03-10 LAB — URINE CULTURE, OB REFLEX

## 2018-03-10 LAB — GLUCOSE TOLERANCE, 1 HOUR: GLUCOSE, 1HR PP: 194 mg/dL (ref 65–199)

## 2018-04-05 ENCOUNTER — Ambulatory Visit (INDEPENDENT_AMBULATORY_CARE_PROVIDER_SITE_OTHER): Payer: Self-pay | Admitting: Student

## 2018-04-05 VITALS — BP 117/72 | HR 81 | Wt 150.0 lb

## 2018-04-05 DIAGNOSIS — Z3A14 14 weeks gestation of pregnancy: Secondary | ICD-10-CM

## 2018-04-05 DIAGNOSIS — Z3491 Encounter for supervision of normal pregnancy, unspecified, first trimester: Secondary | ICD-10-CM

## 2018-04-05 DIAGNOSIS — Z8632 Personal history of gestational diabetes: Secondary | ICD-10-CM

## 2018-04-05 NOTE — Patient Instructions (Signed)
Glucose Tolerance Test During Pregnancy Why am I having this test? The glucose tolerance test (GTT) is done to check how your body processes sugar (glucose). This is one of several tests used to diagnose diabetes that develops during pregnancy (gestational diabetes mellitus). Gestational diabetes is a temporary form of diabetes that some women develop during pregnancy. It usually occurs during the second trimester of pregnancy and goes away after delivery. Testing (screening) for gestational diabetes usually occurs between 24 and 28 weeks of pregnancy. You may have the GTT test after having a 1-hour glucose screening test if the results from that test indicate that you may have gestational diabetes. You may also have this test if:  You have a history of gestational diabetes.  You have a history of giving birth to very large babies or have experienced repeated fetal loss (stillbirth).  You have signs and symptoms of diabetes, such as: ? Changes in your vision. ? Tingling or numbness in your hands or feet. ? Changes in hunger, thirst, and urination that are not otherwise explained by your pregnancy. What is being tested? This test measures the amount of glucose in your blood at different times during a period of 3 hours. This indicates how well your body is able to process glucose. What kind of sample is taken?  Blood samples are required for this test. They are usually collected by inserting a needle into a blood vessel. How do I prepare for this test?  For 3 days before your test, eat normally. Have plenty of carbohydrate-rich foods.  Follow instructions from your health care provider about: ? Eating or drinking restrictions on the day of the test. You may be asked to not eat or drink anything other than water (fast) starting 8-10 hours before the test. ? Changing or stopping your regular medicines. Some medicines may interfere with this test. Tell a health care provider about:  All  medicines you are taking, including vitamins, herbs, eye drops, creams, and over-the-counter medicines.  Any blood disorders you have.  Any surgeries you have had.  Any medical conditions you have. What happens during the test? First, your blood glucose will be measured. This is referred to as your fasting blood glucose, since you fasted before the test. Then, you will drink a glucose solution that contains a certain amount of glucose. Your blood glucose will be measured again 1, 2, and 3 hours after drinking the solution. This test takes about 3 hours to complete. You will need to stay at the testing location during this time. During the testing period:  Do not eat or drink anything other than the glucose solution.  Do not exercise.  Do not use any products that contain nicotine or tobacco, such as cigarettes and e-cigarettes. If you need help stopping, ask your health care provider. The testing procedure may vary among health care providers and hospitals. How are the results reported? Your results will be reported as milligrams of glucose per deciliter of blood (mg/dL) or millimoles per liter (mmol/L). Your health care provider will compare your results to normal ranges that were established after testing a large group of people (reference ranges). Reference ranges may vary among labs and hospitals. For this test, common reference ranges are:  Fasting: less than 95-105 mg/dL (5.3-5.8 mmol/L).  1 hour after drinking glucose: less than 180-190 mg/dL (10.0-10.5 mmol/L).  2 hours after drinking glucose: less than 155-165 mg/dL (8.6-9.2 mmol/L).  3 hours after drinking glucose: 140-145 mg/dL (7.8-8.1 mmol/L). What do the   results mean? Results within reference ranges are considered normal, meaning that your glucose levels are well-controlled. If two or more of your blood glucose levels are high, you may be diagnosed with gestational diabetes. If only one level is high, your health care  provider may suggest repeat testing or other tests to confirm a diagnosis. Talk with your health care provider about what your results mean. Questions to ask your health care provider Ask your health care provider, or the department that is doing the test:  When will my results be ready?  How will I get my results?  What are my treatment options?  What other tests do I need?  What are my next steps? Summary  The glucose tolerance test (GTT) is one of several tests used to diagnose diabetes that develops during pregnancy (gestational diabetes mellitus). Gestational diabetes is a temporary form of diabetes that some women develop during pregnancy.  You may have the GTT test after having a 1-hour glucose screening test if the results from that test indicate that you may have gestational diabetes. You may also have this test if you have any symptoms or risk factors for gestational diabetes.  Talk with your health care provider about what your results mean. This information is not intended to replace advice given to you by your health care provider. Make sure you discuss any questions you have with your health care provider. Document Released: 07/21/2011 Document Revised: 08/31/2016 Document Reviewed: 08/31/2016 Elsevier Interactive Patient Education  2019 [employer]  

## 2018-04-05 NOTE — Progress Notes (Signed)
   PRENATAL VISIT NOTE  Subjective:  Colleen Rose is a 23 y.o. G3P1011 at [redacted]w[redacted]d being seen today for ongoing prenatal care.  She is currently monitored for the following issues for this low-risk pregnancy and has Supervision of high risk pregnancy, antepartum; Maternal varicella, non-immune; and History of gestational diabetes on their problem list.  Patient reports no complaints.  Contractions: Not present. Vag. Bleeding: None.  Movement: Absent. Denies leaking of fluid.   The following portions of the patient's history were reviewed and updated as appropriate: allergies, current medications, past family history, past medical history, past social history, past surgical history and problem list. Problem list updated.  Objective:   Vitals:   04/05/18 1122  BP: 117/72  Pulse: 81  Weight: 150 lb (68 kg)    Fetal Status: Fetal Heart Rate (bpm): 156   Movement: Absent     General:  Alert, oriented and cooperative. Patient is in no acute distress.  Skin: Skin is warm and dry. No rash noted.   Cardiovascular: Normal heart rate noted  Respiratory: Normal respiratory effort, no problems with respiration noted  Abdomen: Soft, gravid, appropriate for gestational age.  Pain/Pressure: Absent     Pelvic: Cervical exam deferred        Extremities: Normal range of motion.  Edema: None  Mental Status: Normal mood and affect. Normal behavior. Normal judgment and thought content.   Assessment and Plan:  Pregnancy: G3P1011 at [redacted]w[redacted]d  1. History of gestational diabetes -failed 1 hour (194) so now needs 3 hour; will do tomorrow.  -started baby ASA today  2. Supervision of normal pregnancy -discussed pediatrician; she will do [facility]  Preterm labor symptoms and general obstetric precautions including but not limited to vaginal bleeding, contractions, leaking of fluid and fetal movement were reviewed in detail with the patient. Please refer to After Visit Summary for other  counseling recommendations.  Return in about 1 day (around 04/06/2018), or 3 hour gtt; and see a provider in 4 weeks.  Future Appointments  Date Time Provider Department Center  04/06/2018  8:20 AM [facility]  05/03/2018 10:55 AM Katrinka Blazing, [place], CNM [facility]    Marylene Land, [place]

## 2018-04-06 ENCOUNTER — Other Ambulatory Visit: Payer: Self-pay

## 2018-04-06 DIAGNOSIS — O0990 Supervision of high risk pregnancy, unspecified, unspecified trimester: Secondary | ICD-10-CM

## 2018-04-06 DIAGNOSIS — Z8632 Personal history of gestational diabetes: Secondary | ICD-10-CM

## 2018-04-08 LAB — GESTATIONAL GLUCOSE TOLERANCE: Glucose, Fasting: 98 mg/dL — ABNORMAL HIGH (ref 65–94)

## 2018-04-13 ENCOUNTER — Other Ambulatory Visit: Payer: Self-pay

## 2018-04-13 ENCOUNTER — Other Ambulatory Visit: Payer: Self-pay | Admitting: *Deleted

## 2018-04-13 DIAGNOSIS — Z8632 Personal history of gestational diabetes: Secondary | ICD-10-CM

## 2018-04-14 ENCOUNTER — Other Ambulatory Visit: Payer: Self-pay

## 2018-04-14 DIAGNOSIS — Z8632 Personal history of gestational diabetes: Secondary | ICD-10-CM

## 2018-04-15 LAB — GESTATIONAL GLUCOSE TOLERANCE
GLUCOSE 3 HOUR GTT: 132 mg/dL (ref 65–139)
Glucose, Fasting: 87 mg/dL (ref 65–94)
Glucose, GTT - 1 Hour: 182 mg/dL — ABNORMAL HIGH (ref 65–179)
Glucose, GTT - 2 Hour: 122 mg/dL (ref 65–154)

## 2018-05-02 NOTE — Patient Instructions (Signed)
Coronavirus (COVID-19) Are you at risk?  Are you at risk for the Coronavirus (COVID-19)?  To be considered HIGH RISK for Coronavirus (COVID-19), you have to meet the following criteria:  . Traveled to [place], [place], [place], [place] or [place]; or in the [place] to [place], [place], [place], or [place]; and have fever, cough, and shortness of breath within the last 2 weeks of travel OR . Been in close contact with a person diagnosed with COVID-19 within the last 2 weeks and have fever, cough, and shortness of breath . IF YOU DO NOT MEET THESE CRITERIA, YOU ARE CONSIDERED LOW RISK FOR COVID-19.  What to do if you are HIGH RISK for COVID-19?  . If you are having a medical emergency, call 911. . Seek medical care right away. Before you go to a doctor's office, urgent care or emergency department, call ahead and tell them about your recent travel, contact with someone diagnosed with COVID-19, and your symptoms. You should receive instructions from your physician's office regarding next steps of care.  . When you arrive at healthcare provider, tell the healthcare staff immediately you have returned from visiting [place], [place], [place], [place] or [place]; or traveled in the [place] to [place], [place], [place], or [place]; in the last two weeks or you have been in close contact with a person diagnosed with COVID-19 in the last 2 weeks.   . Tell the health care staff about your symptoms: fever, cough and shortness of breath. . After you have been seen by a medical provider, you will be either: o Tested for (COVID-19) and discharged home on quarantine except to seek medical care if symptoms worsen, and asked to  - Stay home and avoid contact with others until you get your results (4-5 days)  - Avoid travel on public transportation if possible (such as bus, train, or airplane) or o Sent to the Emergency Department by EMS for evaluation, COVID-19 testing, and possible  admission depending on your condition and test results.  What to do if you are LOW RISK for COVID-19?  Reduce your risk of any infection by using the same precautions used for avoiding the common cold or flu:  . Wash your hands often with soap and warm water for at least 20 seconds.  If soap and water are not readily available, use an alcohol-based hand sanitizer with at least 60% alcohol.  . If coughing or sneezing, cover your mouth and nose by coughing or sneezing into the elbow areas of your shirt or coat, into a tissue or into your sleeve (not your hands). . Avoid shaking hands with others and consider head nods or verbal greetings only. . Avoid touching your eyes, nose, or mouth with unwashed hands.  . Avoid close contact with people who are sick. . Avoid places or events with large numbers of people in one location, like concerts or sporting events. . Carefully consider travel plans you have or are making. . If you are planning any travel outside or inside the [place], visit the CDC's Travelers' Health webpage for the latest health notices. . If you have some symptoms but not all symptoms, continue to monitor at home and seek medical attention if your symptoms worsen. . If you are having a medical emergency, call 911.   ADDITIONAL HEALTHCARE OPTIONS FOR PATIENTS  [facility] / e-Visit: [email or web address]         [facility]: [phone]  [facility]: [phone]                   [facility]: [phone]    Second Trimester of Pregnancy The second trimester is from week 14 through week 27 (months 4 through 6). The second trimester is often a time when you feel your best. Your body has adjusted to being pregnant, and you begin to feel better physically. Usually, morning sickness has lessened or quit completely, you may have more energy, and you may have an increase in appetite. The second  trimester is also a time when the fetus is growing rapidly. At the end of the sixth month, the fetus is about 9 inches long and weighs about 1 pounds. You will likely begin to feel the baby move (quickening) between 16 and 20 weeks of pregnancy. Body changes during your second trimester Your body continues to go through many changes during your second trimester. The changes vary from woman to woman.  Your weight will continue to increase. You will notice your lower abdomen bulging out.  You may begin to get stretch marks on your hips, abdomen, and breasts.  You may develop headaches that can be relieved by medicines. The medicines should be approved by your health care provider.  You may urinate more often because the fetus is pressing on your bladder.  You may develop or continue to have heartburn as a result of your pregnancy.  You may develop constipation because certain hormones are causing the muscles that push waste through your intestines to slow down.  You may develop hemorrhoids or swollen, bulging veins (varicose veins).  You may have back pain. This is caused by: ? Weight gain. ? Pregnancy hormones that are relaxing the joints in your pelvis. ? A shift in weight and the muscles that support your balance.  Your breasts will continue to grow and they will continue to become tender.  Your gums may bleed and may be sensitive to brushing and flossing.  Dark spots or blotches (chloasma, mask of pregnancy) may develop on your face. This will likely fade after the baby is born.  A dark line from your belly button to the pubic area (linea nigra) may appear. This will likely fade after the baby is born.  You may have changes in your hair. These can include thickening of your hair, rapid growth, and changes in texture. Some women also have hair loss during or after pregnancy, or hair that feels dry or thin. Your hair will most likely return to normal after your baby is born. What to  expect at prenatal visits During a routine prenatal visit:  You will be weighed to make sure you and the fetus are growing normally.  Your blood pressure will be taken.  Your abdomen will be measured to track your baby's growth.  The fetal heartbeat will be listened to.  Any test results from the previous visit will be discussed. Your health care provider may ask you:  How you are feeling.  If you are feeling the baby move.  If you have had any abnormal symptoms, such as leaking fluid, bleeding, severe headaches, or abdominal cramping.  If you are using any tobacco products, including cigarettes, chewing tobacco, and electronic cigarettes.  If you have any questions. Other tests that may be performed during your second trimester include:  Blood tests that check for: ? Low iron levels (anemia). ? High blood sugar that affects pregnant women (gestational diabetes) between 45 and  28 weeks. ? Rh antibodies. This is to check for a protein on red blood cells (Rh factor).  Urine tests to check for infections, diabetes, or protein in the urine.  An ultrasound to confirm the proper growth and development of the baby.  An amniocentesis to check for possible genetic problems.  Fetal screens for spina bifida and Down syndrome.  HIV (human immunodeficiency virus) testing. Routine prenatal testing includes screening for HIV, unless you choose not to have this test. Follow these instructions at home: Medicines  Follow your health care provider's instructions regarding medicine use. Specific medicines may be either safe or unsafe to take during pregnancy.  Take a prenatal vitamin that contains at least 600 micrograms (mcg) of folic acid.  If you develop constipation, try taking a stool softener if your health care provider approves. Eating and drinking   Eat a balanced diet that includes fresh fruits and vegetables, whole grains, good sources of protein such as meat, eggs, or tofu,  and low-fat dairy. Your health care provider will help you determine the amount of weight gain that is right for you.  Avoid raw meat and uncooked cheese. These carry germs that can cause birth defects in the baby.  If you have low calcium intake from food, talk to your health care provider about whether you should take a daily calcium supplement.  Limit foods that are high in fat and processed sugars, such as fried and sweet foods.  To prevent constipation: ? Drink enough fluid to keep your urine clear or pale yellow. ? Eat foods that are high in fiber, such as fresh fruits and vegetables, whole grains, and beans. Activity  Exercise only as directed by your health care provider. Most women can continue their usual exercise routine during pregnancy. Try to exercise for 30 minutes at least 5 days a week. Stop exercising if you experience uterine contractions.  Avoid heavy lifting, wear low heel shoes, and practice good posture.  A sexual relationship may be continued unless your health care provider directs you otherwise. Relieving pain and discomfort  Wear a good support bra to prevent discomfort from breast tenderness.  Take warm sitz baths to soothe any pain or discomfort caused by hemorrhoids. Use hemorrhoid cream if your health care provider approves.  Rest with your legs elevated if you have leg cramps or low back pain.  If you develop varicose veins, wear support hose. Elevate your feet for 15 minutes, 3-4 times a day. Limit salt in your diet. Prenatal Care  Write down your questions. Take them to your prenatal visits.  Keep all your prenatal visits as told by your health care provider. This is important. Safety  Wear your seat belt at all times when driving.  Make a list of emergency phone numbers, including numbers for family, friends, the hospital, and police and fire departments. General instructions  Ask your health care provider for a referral to a local prenatal  education class. Begin classes no later than the beginning of month 6 of your pregnancy.  Ask for help if you have counseling or nutritional needs during pregnancy. Your health care provider can offer advice or refer you to specialists for help with various needs.  Do not use hot tubs, steam rooms, or saunas.  Do not douche or use tampons or scented sanitary pads.  Do not cross your legs for long periods of time.  Avoid cat litter boxes and soil used by cats. These carry germs that can cause birth defects  in the baby and possibly loss of the fetus by miscarriage or stillbirth.  Avoid all smoking, herbs, alcohol, and unprescribed drugs. Chemicals in these products can affect the formation and growth of the baby.  Do not use any products that contain nicotine or tobacco, such as cigarettes and e-cigarettes. If you need help quitting, ask your health care provider.  Visit your dentist if you have not gone yet during your pregnancy. Use a soft toothbrush to brush your teeth and be gentle when you floss. Contact a health care provider if:  You have dizziness.  You have mild pelvic cramps, pelvic pressure, or nagging pain in the abdominal area.  You have persistent nausea, vomiting, or diarrhea.  You have a bad smelling vaginal discharge.  You have pain when you urinate. Get help right away if:  You have a fever.  You are leaking fluid from your vagina.  You have spotting or bleeding from your vagina.  You have severe abdominal cramping or pain.  You have rapid weight gain or weight loss.  You have shortness of breath with chest pain.  You notice sudden or extreme swelling of your face, hands, ankles, feet, or legs.  You have not felt your baby move in over an hour.  You have severe headaches that do not go away when you take medicine.  You have vision changes. Summary  The second trimester is from week 14 through week 27 (months 4 through 6). It is also a time when the  fetus is growing rapidly.  Your body goes through many changes during pregnancy. The changes vary from woman to woman.  Avoid all smoking, herbs, alcohol, and unprescribed drugs. These chemicals affect the formation and growth your baby.  Do not use any tobacco products, such as cigarettes, chewing tobacco, and e-cigarettes. If you need help quitting, ask your health care provider.  Contact your health care provider if you have any questions. Keep all prenatal visits as told by your health care provider. This is important. This information is not intended to replace advice given to you by your health care provider. Make sure you discuss any questions you have with your health care provider. Document Released: 01/13/2001 Document Revised: 02/25/2016 Document Reviewed: 02/25/2016 Elsevier Interactive Patient Education  2019 [employer].

## 2018-05-02 NOTE — Progress Notes (Signed)
    PRENATAL VISIT NOTE  Telehealth Visit performed due to COVID-19 Pandemic.   Subjective:  Colleen Rose is a 23 y.o. G3P1011 at [redacted]w[redacted]d being seen today for ongoing prenatal care.  She is currently monitored for the following issues for this low-risk pregnancy and has Supervision of high risk pregnancy, antepartum; Maternal varicella, non-immune; and History of gestational diabetes on their problem list.  Patient reports no complaints.  Contractions: Not present. Vag. Bleeding: None.  Movement: Present. Denies leaking of fluid.   The following portions of the patient's history were reviewed and updated as appropriate: allergies, current medications, past family history, past medical history, past social history, past surgical history and problem list.   Objective:  There were no vitals filed for this visit.  Fetal Status:     Movement: Present    Fundus U/1 per pt.  Exam deferred due to telehealth visit  Assessment and Plan:  Pregnancy: G3P1011 at [redacted]w[redacted]d 1. Supervision of high risk pregnancy, antepartum - Offered AFP/NIPS--declined - Anatomy US ordered - Will pick up cuff and check weekly BP's  - Signed up for [facility], MyChart  2. History of gestational diabetes - GTT, 28 week labs at [facility]  3. Maternal varicella, non-immune - Offer vaccine PP  Preterm labor symptoms and general obstetric precautions including but not limited to vaginal bleeding, contractions, leaking of fluid and fetal movement were reviewed in detail with the patient. Please refer to After Visit Summary for other counseling recommendations.   F/U 8 weeks  Future Appointments  Date Time Provider Department Center  05/03/2018 10:55 AM Katrinka Blazing, [place], CNM [facility]    [place], [place]

## 2018-05-03 ENCOUNTER — Other Ambulatory Visit: Payer: Self-pay

## 2018-05-03 ENCOUNTER — Ambulatory Visit (INDEPENDENT_AMBULATORY_CARE_PROVIDER_SITE_OTHER): Payer: Self-pay | Admitting: Advanced Practice Midwife

## 2018-05-03 DIAGNOSIS — O0990 Supervision of high risk pregnancy, unspecified, unspecified trimester: Secondary | ICD-10-CM

## 2018-05-03 DIAGNOSIS — Z8632 Personal history of gestational diabetes: Secondary | ICD-10-CM

## 2018-05-03 DIAGNOSIS — Z2839 Other underimmunization status: Secondary | ICD-10-CM

## 2018-05-03 DIAGNOSIS — Z283 Underimmunization status: Secondary | ICD-10-CM

## 2018-05-03 DIAGNOSIS — O09893 Supervision of other high risk pregnancies, third trimester: Secondary | ICD-10-CM

## 2018-05-03 DIAGNOSIS — Z3A18 18 weeks gestation of pregnancy: Secondary | ICD-10-CM

## 2018-05-03 DIAGNOSIS — O09899 Supervision of other high risk pregnancies, unspecified trimester: Secondary | ICD-10-CM

## 2018-05-03 NOTE — Progress Notes (Addendum)
I connected with  Colleen Rose on 05/03/18 at 10:55 AM EDT by telephone and verified that I am speaking with the correct person using two identifiers.   I discussed the limitations, risks, security and privacy concerns of performing an evaluation and management service by telephone and the availability of in person appointments. I also discussed with the patient that there may be a patient responsible charge related to this service. The patient expressed understanding and agreed to proceed.  [facility]RON 3 SERIES CUFF OFFERED TODAY FOR PATIENT TO PICK UP 05/04/2018   Janene Madeira Kenyetta Wimbish, CMA 05/03/2018  10:01 AM

## 2018-06-01 ENCOUNTER — Telehealth: Payer: Self-pay | Admitting: *Deleted

## 2018-06-01 NOTE — Telephone Encounter (Signed)
Called pt regarding baby scripts and several weeks of missed BP log ins.  Pt at the store during the time of the phone call and she stated that she would take a BP and log it in the app as soon as she gets home.

## 2018-06-24 ENCOUNTER — Telehealth: Payer: Self-pay | Admitting: Obstetrics & Gynecology

## 2018-06-24 NOTE — Telephone Encounter (Signed)
Called the patient to confirm upcoming appointment. Left a detailed message with the office location, date and time. °

## 2018-06-28 ENCOUNTER — Telehealth: Payer: Self-pay | Admitting: Student

## 2018-06-28 ENCOUNTER — Encounter: Payer: Self-pay | Admitting: Student

## 2018-06-28 ENCOUNTER — Other Ambulatory Visit: Payer: Self-pay

## 2018-06-28 NOTE — Telephone Encounter (Signed)
The patient called in to reschedule the appointment due to car issues.

## 2018-07-06 ENCOUNTER — Telehealth: Payer: Self-pay | Admitting: Nurse Practitioner

## 2018-07-06 NOTE — Telephone Encounter (Signed)
Changed the visit status to virtual as the provider's schedule is now mychart visits only. Called the patient to inform of the change and also informed of wearing a mask, screening, and no visitors due to COVID19.

## 2018-07-07 ENCOUNTER — Telehealth (INDEPENDENT_AMBULATORY_CARE_PROVIDER_SITE_OTHER): Payer: Self-pay | Admitting: Obstetrics and Gynecology

## 2018-07-07 ENCOUNTER — Other Ambulatory Visit: Payer: Self-pay

## 2018-07-07 ENCOUNTER — Encounter: Payer: Self-pay | Admitting: Obstetrics and Gynecology

## 2018-07-07 DIAGNOSIS — O0990 Supervision of high risk pregnancy, unspecified, unspecified trimester: Secondary | ICD-10-CM

## 2018-07-07 DIAGNOSIS — Z3A27 27 weeks gestation of pregnancy: Secondary | ICD-10-CM

## 2018-07-07 DIAGNOSIS — O24419 Gestational diabetes mellitus in pregnancy, unspecified control: Secondary | ICD-10-CM

## 2018-07-07 DIAGNOSIS — O0992 Supervision of high risk pregnancy, unspecified, second trimester: Secondary | ICD-10-CM

## 2018-07-07 NOTE — Progress Notes (Signed)
I connected with  Colleen Rose on 07/07/18 at 10:15 AM EDT by telephone and verified that I am speaking with the correct person using two identifiers.   I discussed the limitations, risks, security and privacy concerns of performing an evaluation and management service by telephone and the availability of in person appointments. I also discussed with the patient that there may be a patient responsible charge related to this service. The patient expressed understanding and agreed to proceed.  Janene Madeira Mayfield Schoene, CMA 07/07/2018  10:18 AM

## 2018-07-07 NOTE — Patient Instructions (Signed)
CIRCUMCISION  Circumcision is considered an elective/non-medically necessary procedure. There are many reasons parents decide to have their sons circumsized. During the first year of life circumcised males have a reduced risk of urinary tract infections but after this year the rates between circumcised males and uncircumcised males are the same.  It is safe to have your son circumcised outside of the hospital and the places above perform them regularly.    Places to have your son circumcised:    [facility] [phone] $480 by 4 wks  [facility] [phone] $244 by 4 wks  [facility] [phone] $[phone] by 2 wks  [facility] [phone] $250 by 7 days [facility] [phone] $150 by 4 wks  These prices sometimes change but are roughly what you can expect to pay. Please call and confirm pricing.    Deciding about Circumcision in Baby Boys  (The Basics)  What is circumcision?  Circumcision is a surgery that removes the skin that covers the tip of the penis, called the "foreskin" Circumcision is usually done when a boy is between 1 and 10 days old. In the [place], circumcision is common. In some other countries, fewer boys are circumcised. Circumcision is a common tradition in some religions.  Should I have my baby boy circumcised?  There is no easy answer. Circumcision has some benefits. But it also has risks. After talking with your doctor, you will have to decide for yourself what is right for your family.  What are the benefits of circumcision?  Circumcised boys seem to have slightly lower rates of: ?Urinary tract infections ?Swelling of the opening at the tip of the penis Circumcised men seem to have slightly lower rates of: ?Urinary tract infections ?Swelling of the opening at the tip of the penis ?Penis  cancer ?HIV and other infections that you catch during sex ?Cervical cancer in the women they have sex with Even so, in the [place], the risks of these problems are small - even in boys and men who have not been circumcised. Plus, boys and men who are not circumcised can reduce these extra risks by: ?Cleaning their penis well ?Using condoms during sex  What are the risks of circumcision?  Risks include: ?Bleeding or infection from the surgery ?Damage to or amputation of the penis ?A chance that the doctor will cut off too much or not enough of the foreskin ?A chance that sex won't feel as good later in life Only about 1 out of every 200 circumcisions leads to problems. There is also a chance that your health insurance won't pay for circumcision.  How is circumcision done in baby boys?  First, the baby gets medicine for pain relief. This might be a cream on the skin or a shot into the base of the penis. Next, the doctor cleans the baby's penis well. Then he or she uses special tools to cut off the foreskin. Finally, the doctor wraps a bandage (called gauze) around the baby's penis. If you have your baby circumcised, his doctor or nurse will give you instructions on how to care for him after the surgery. It is important that you follow those instructions carefully.   

## 2018-07-07 NOTE — Progress Notes (Signed)
   TELEHEALTH VIRTUAL OBSTETRICS VISIT ENCOUNTER NOTE  I connected with Colleen Rose on 07/09/18 at 10:15 AM EDT by telephone at home and verified that I am speaking with the correct person using two identifiers.   I discussed the limitations, risks, security and privacy concerns of performing an evaluation and management service by telephone and the availability of in person appointments. I also discussed with the patient that there may be a patient responsible charge related to this service. The patient expressed understanding and agreed to proceed.  Subjective:  Colleen Rose is a 23 y.o. G3P1011 at [redacted]w[redacted]d being followed for ongoing prenatal care.  She is currently monitored for the following issues for this high-risk pregnancy and has Supervision of high risk pregnancy, antepartum; Maternal varicella, non-immune; History of gestational diabetes; and Gestational diabetes mellitus (GDM) affecting pregnancy on their problem list.  Patient reports no complaints. Reports fetal movement. Denies any contractions, bleeding or leaking of fluid.   The following portions of the patient's history were reviewed and updated as appropriate: allergies, current medications, past family history, past medical history, past social history, past surgical history and problem list.   Objective:   General:  Alert, oriented and cooperative.   Mental Status: Normal mood and affect perceived. Normal judgment and thought content.  Rest of physical exam deferred due to type of encounter  Assessment and Plan:  Pregnancy: G3P1011 at [redacted]w[redacted]d 1. Gestational diabetes mellitus (GDM) affecting pregnancy  Patient had early 1 hour and failed, then had 2 hour GTT and failed the fasting portion and then vomited and could not continue the 2 hour. Based on the 98 fasting this is a diagnosis of GDM.  Reviewed all of her labs with Dr. Marice Potter Patient will be scheduled with Diabetes educator ASAP. She was informed  of the information.  Continue ASA  Discussed low sugar, low carbohydrate diet.   2. Supervision of high risk pregnancy, antepartum  Doing well   Preterm labor symptoms and general obstetric precautions including but not limited to vaginal bleeding, contractions, leaking of fluid and fetal movement were reviewed in detail with the patient.  I discussed the assessment and treatment plan with the patient. The patient was provided an opportunity to ask questions and all were answered. The patient agreed with the plan and demonstrated an understanding of the instructions. The patient was advised to call back or seek an in-person office evaluation/go [facility] at [facility] for any urgent or concerning symptoms. Please refer to After Visit Summary for other counseling recommendations.   I provided 15 minutes of non-face-to-face time during this encounter.  Return in about 2 weeks (around 07/21/2018) for High risk OB visit in 2 weeks. Needs to see DM ed. .  Future Appointments  Date Time Provider Department Center  07/13/2018  4:00 PM Rosette Reveal [facility]  07/21/2018 10:15 AM Maryetta Shafer, Harolyn Rutherford, NP [facility]    Venia Carbon, NP Center for [employer], [facility]

## 2018-07-08 LAB — RPR: RPR Ser Ql: NONREACTIVE

## 2018-07-08 LAB — CBC
Hematocrit: 34.2 % (ref 34.0–46.6)
Hemoglobin: 11.8 g/dL (ref 11.1–15.9)
MCH: 27.8 pg (ref 26.6–33.0)
MCHC: 34.5 g/dL (ref 31.5–35.7)
MCV: 81 fL (ref 79–97)
Platelets: 322 10*3/uL (ref 150–450)
RBC: 4.24 x10E6/uL (ref 3.77–5.28)
RDW: 12.7 % (ref 11.7–15.4)
WBC: 15 10*3/uL — ABNORMAL HIGH (ref 3.4–10.8)

## 2018-07-08 LAB — HIV ANTIBODY (ROUTINE TESTING W REFLEX): HIV Screen 4th Generation wRfx: NONREACTIVE

## 2018-07-13 ENCOUNTER — Other Ambulatory Visit: Payer: Self-pay

## 2018-07-19 ENCOUNTER — Ambulatory Visit: Payer: Self-pay | Admitting: *Deleted

## 2018-07-19 ENCOUNTER — Encounter: Payer: Self-pay | Attending: Obstetrics and Gynecology | Admitting: *Deleted

## 2018-07-19 ENCOUNTER — Telehealth: Payer: Self-pay

## 2018-07-19 ENCOUNTER — Other Ambulatory Visit: Payer: Self-pay

## 2018-07-19 DIAGNOSIS — O24419 Gestational diabetes mellitus in pregnancy, unspecified control: Secondary | ICD-10-CM | POA: Insufficient documentation

## 2018-07-19 DIAGNOSIS — Z713 Dietary counseling and surveillance: Secondary | ICD-10-CM | POA: Insufficient documentation

## 2018-07-19 NOTE — Telephone Encounter (Signed)
Called the patient to confirm the upcoming appointment. Left a detailed voicemail message of how to access the mychart virtual visit as well as our office number. Informed the patient of logging into the mychart 5-10 mins prior to the appointment time. The appointment can be accessed by tapping on the appointment time. A link will appear that states click here to begin virtual visit. Once you click the link the virtual visit will begin. °

## 2018-07-20 ENCOUNTER — Telehealth: Payer: Self-pay | Admitting: General Practice

## 2018-07-20 NOTE — Telephone Encounter (Signed)
Received alert from [facility] due to patient having elevated blood pressure of 143/73. Per review, patient has appt tomorrow with Noni Saupe. BP will be repeated at her appt tomorrow and can be reviewed then per Dr Harolyn Rutherford.

## 2018-07-20 NOTE — Progress Notes (Signed)
  Patient was seen on 07/19/2018 for Gestational Diabetes self-management. EDD 10/17/2018. Patient states history of GDM with last pregnancy 3 years ago. She tested her BG then, did not need any medication.  Diet history obtained. Patient eats good variety of all food groups. Beverages include coffee, Sunny Delight juice, regular soda and some water.  The following learning objectives were met by the patient :   States the definition of Gestational Diabetes  States why dietary management is important in controlling blood glucose  Describes the effects of carbohydrates on blood glucose levels  Demonstrates ability to create a balanced meal plan  Demonstrates carbohydrate counting   States when to check blood glucose levels  Demonstrates proper blood glucose monitoring techniques  States the effect of stress and exercise on blood glucose levels  States the importance of limiting caffeine and abstaining from alcohol and smoking  Plan:  Aim for 3 Carb Choices per meal (45 grams) +/- 1 either way  Aim for 1-2 Carbs per snack Begin reading food labels for Total Carbohydrate of foods If OK with your MD, consider  increasing your activity level by walking, Arm Chair Exercises or other activity daily as tolerated Begin checking BG before breakfast and 2 hours after first bite of breakfast, lunch and dinner as directed by MD  Bring Log Book/Sheet and meter to every medical appointment OR use Baby Scripts (see below) Baby Scripts:  Patient was introduced to [employer] and plans to use as record of BG electronically  Take medication if directed by MD  Provided patient with [employer] meter and supplies for 30 days Lot # [id number] Patient instructed to test BG fasting and 2 hours after meals.   Patient instructed to monitor glucose levels: FBS: 60 - 95 mg/dl 2 hour: <120 mg/dl  Patient received the following handouts:  Nutrition Diabetes and Pregnancy  Carbohydrate Counting List  BG Log  Sheet  Patient will be seen for follow-up  as needed.

## 2018-07-21 ENCOUNTER — Other Ambulatory Visit: Payer: Self-pay

## 2018-07-21 ENCOUNTER — Telehealth (INDEPENDENT_AMBULATORY_CARE_PROVIDER_SITE_OTHER): Payer: Self-pay | Admitting: Obstetrics and Gynecology

## 2018-07-21 VITALS — BP 105/73 | HR 91

## 2018-07-21 DIAGNOSIS — O0990 Supervision of high risk pregnancy, unspecified, unspecified trimester: Secondary | ICD-10-CM

## 2018-07-21 DIAGNOSIS — O24419 Gestational diabetes mellitus in pregnancy, unspecified control: Secondary | ICD-10-CM

## 2018-07-21 DIAGNOSIS — Z3A29 29 weeks gestation of pregnancy: Secondary | ICD-10-CM

## 2018-07-21 NOTE — Progress Notes (Signed)
I connected with  Colleen Rose on 07/21/18 at 10:15 AM EDT by telephone and verified that I am speaking with the correct person using two identifiers.   I discussed the limitations, risks, security and privacy concerns of performing an evaluation and management service by telephone and the availability of in person appointments. I also discussed with the patient that there may be a patient responsible charge related to this service. The patient expressed understanding and agreed to proceed.  [place], [place] 07/21/2018  10:24 AM

## 2018-07-21 NOTE — Progress Notes (Signed)
   TELEHEALTH VIRTUAL OBSTETRICS VISIT ENCOUNTER NOTE  I connected with Colleen Rose on 07/21/18 at 10:15 AM EDT by telephone at home and verified that I am speaking with the correct person using two identifiers.   I discussed the limitations, risks, security and privacy concerns of performing an evaluation and management service by telephone and the availability of in person appointments. I also discussed with the patient that there may be a patient responsible charge related to this service. The patient expressed understanding and agreed to proceed.  Subjective:  Colleen Rose is a 23 y.o. G3P1011 at 4w4dbeing followed for ongoing prenatal care.  She is currently monitored for the following issues for this high-risk pregnancy and has Supervision of high risk pregnancy, antepartum; Maternal varicella, non-immune; History of gestational diabetes; and Gestational diabetes mellitus (GDM) affecting pregnancy on their problem list.  Patient reports no complaints. Reports fetal movement. Denies any contractions, bleeding or leaking of fluid.   The following portions of the patient's history were reviewed and updated as appropriate: allergies, current medications, past family history, past medical history, past social history, past surgical history and problem list.   Objective:   General:  Alert, oriented and cooperative.   Mental Status: Normal mood and affect perceived. Normal judgment and thought content.  Rest of physical exam deferred due to type of encounter  Assessment and Plan:  Pregnancy: G3P1011 at 268w4d1. Gestational diabetes mellitus (GDM) affecting pregnancy  Met with DM educator. Has all supplies.  Started checking her BS this morning: Fasting glucose this morning was 100.  Continue BASA Continue low sugar, low carb diet.   BP normal today.   Preterm labor symptoms and general obstetric precautions including but not limited to vaginal bleeding,  contractions, leaking of fluid and fetal movement were reviewed in detail with the patient.  I discussed the assessment and treatment plan with the patient. The patient was provided an opportunity to ask questions and all were answered. The patient agreed with the plan and demonstrated an understanding of the instructions. The patient was advised to call back or seek an in-person office evaluation/[facility] or concerning [facility]. Please refer to After Visit Summary for other counseling recommendations.   I provided 10 minutes of non-face-to-face time during this encounter.  Return in about 2 weeks (around 08/04/2018), or High risk, MD only.  No future appointments.  JeNoni SaupeNP [facility]

## 2018-07-27 ENCOUNTER — Encounter: Payer: Self-pay | Admitting: *Deleted

## 2018-07-28 ENCOUNTER — Telehealth (INDEPENDENT_AMBULATORY_CARE_PROVIDER_SITE_OTHER): Payer: Self-pay | Admitting: Lactation Services

## 2018-07-28 ENCOUNTER — Telehealth: Payer: Self-pay | Admitting: Lactation Services

## 2018-07-28 DIAGNOSIS — O0990 Supervision of high risk pregnancy, unspecified, unspecified trimester: Secondary | ICD-10-CM

## 2018-07-28 NOTE — Telephone Encounter (Signed)
Called pt with assistance of [facility] Phone Spanish Interpreter Colleen Rose # [phone] to inform her that she has an [place] scheduled for 6/29 @ 1:45 pm.   Pt speaks fluent English and does not need an interpreter. Informed pt of upcoming appt date, time and location. Pt voiced understanding.

## 2018-07-28 NOTE — Telephone Encounter (Signed)
-----   Message from Kelly M Davis, MD sent at 07/28/2018  9:39 AM EDT ----- This patient needs a repeat anatomy US, originally done at [place], unclear why. Growth was < 3rd percentile on that scan by LMP but normal if dating was off. Should have MFM scan as her dating was not certain on [facility]. 

## 2018-07-28 NOTE — Telephone Encounter (Signed)
-----   Message from Sloan Leiter, MD sent at 07/28/2018  9:39 AM EDT ----- This patient needs a repeat anatomy US, originally done at [place], unclear why. Growth was < 3rd percentile on that scan by LMP but normal if dating was off. Should have MFM scan as her dating was not certain on [facility].

## 2018-07-30 ENCOUNTER — Encounter: Payer: Self-pay | Admitting: Family Medicine

## 2018-08-01 ENCOUNTER — Encounter: Payer: Self-pay | Admitting: Family Medicine

## 2018-08-01 ENCOUNTER — Other Ambulatory Visit: Payer: Self-pay

## 2018-08-01 ENCOUNTER — Other Ambulatory Visit: Payer: Self-pay | Admitting: Family Medicine

## 2018-08-01 ENCOUNTER — Ambulatory Visit (HOSPITAL_COMMUNITY)
Admission: RE | Admit: 2018-08-01 | Discharge: 2018-08-01 | Disposition: A | Discharge status: Home / Self Care | Payer: Self-pay | Source: Ambulatory Visit | Attending: Family Medicine | Admitting: Family Medicine

## 2018-08-01 DIAGNOSIS — Z363 Encounter for antenatal screening for malformations: Secondary | ICD-10-CM

## 2018-08-01 DIAGNOSIS — O0990 Supervision of high risk pregnancy, unspecified, unspecified trimester: Secondary | ICD-10-CM | POA: Insufficient documentation

## 2018-08-01 DIAGNOSIS — O09293 Supervision of pregnancy with other poor reproductive or obstetric history, third trimester: Secondary | ICD-10-CM

## 2018-08-01 DIAGNOSIS — O365990 Maternal care for other known or suspected poor fetal growth, unspecified trimester, not applicable or unspecified: Secondary | ICD-10-CM | POA: Insufficient documentation

## 2018-08-01 DIAGNOSIS — O365930 Maternal care for other known or suspected poor fetal growth, third trimester, not applicable or unspecified: Secondary | ICD-10-CM

## 2018-08-01 DIAGNOSIS — O24410 Gestational diabetes mellitus in pregnancy, diet controlled: Secondary | ICD-10-CM

## 2018-08-01 DIAGNOSIS — Z3A31 31 weeks gestation of pregnancy: Secondary | ICD-10-CM

## 2018-08-01 DIAGNOSIS — O26843 Uterine size-date discrepancy, third trimester: Secondary | ICD-10-CM

## 2018-08-02 ENCOUNTER — Telehealth: Payer: Self-pay | Admitting: Advanced Practice Midwife

## 2018-08-02 ENCOUNTER — Other Ambulatory Visit (HOSPITAL_COMMUNITY): Payer: Self-pay | Admitting: *Deleted

## 2018-08-02 ENCOUNTER — Telehealth: Payer: Self-pay | Admitting: General Practice

## 2018-08-02 DIAGNOSIS — O365990 Maternal care for other known or suspected poor fetal growth, unspecified trimester, not applicable or unspecified: Secondary | ICD-10-CM

## 2018-08-02 NOTE — Telephone Encounter (Signed)
Called the patient to inform of the upcoming mychart visit. The patient verbalized understanding. °

## 2018-08-02 NOTE — Telephone Encounter (Signed)
Received alert from Babyscripts due to patient having elevated blood sugars. Per chart review, patient has appt tomorrow with Dr Rosana Hoes. Blood sugars will be addressed at that appt.

## 2018-08-03 ENCOUNTER — Other Ambulatory Visit: Payer: Self-pay

## 2018-08-03 ENCOUNTER — Telehealth (INDEPENDENT_AMBULATORY_CARE_PROVIDER_SITE_OTHER): Payer: Self-pay | Admitting: Obstetrics and Gynecology

## 2018-08-03 ENCOUNTER — Encounter: Payer: Self-pay | Admitting: Obstetrics and Gynecology

## 2018-08-03 VITALS — BP 107/66 | HR 80

## 2018-08-03 DIAGNOSIS — Z8632 Personal history of gestational diabetes: Secondary | ICD-10-CM

## 2018-08-03 DIAGNOSIS — O24419 Gestational diabetes mellitus in pregnancy, unspecified control: Secondary | ICD-10-CM

## 2018-08-03 DIAGNOSIS — O0993 Supervision of high risk pregnancy, unspecified, third trimester: Secondary | ICD-10-CM

## 2018-08-03 DIAGNOSIS — Z3A31 31 weeks gestation of pregnancy: Secondary | ICD-10-CM

## 2018-08-03 DIAGNOSIS — K829 Disease of gallbladder, unspecified: Secondary | ICD-10-CM

## 2018-08-03 DIAGNOSIS — O0990 Supervision of high risk pregnancy, unspecified, unspecified trimester: Secondary | ICD-10-CM

## 2018-08-03 DIAGNOSIS — O365930 Maternal care for other known or suspected poor fetal growth, third trimester, not applicable or unspecified: Secondary | ICD-10-CM

## 2018-08-03 MED ORDER — METFORMIN HCL 500 MG PO TABS: 500.0000 mg | ORAL_TABLET | Freq: Every day | ORAL | 3 refills | Status: DC

## 2018-08-03 NOTE — Progress Notes (Addendum)
TELEHEALTH OBSTETRICS PRENATAL VIRTUAL VIDEO VISIT ENCOUNTER NOTE  [facility]   I connected with Colleen CarolEdith Enriquez Rose on 08/03/18 at 11:15 AM EDT by MyChart Video Encounter at home and verified that I am speaking with the correct person using two identifiers.   I discussed the limitations, risks, security and privacy concerns of performing an evaluation and management service by telephone and the availability of in person appointments. I also discussed with the patient that there may be a patient responsible charge related to this service. The patient expressed understanding and agreed to proceed. Subjective:  Colleen Rose is a 23 y.o. G3P1011 at 4645w3d being seen today for ongoing prenatal care.  She is currently monitored for the following issues for this high-risk pregnancy and has Supervision of high risk pregnancy, antepartum; Maternal varicella, non-immune; History of gestational diabetes; Gestational diabetes mellitus (GDM) affecting pregnancy; and IUGR (intrauterine growth restriction) affecting care of mother on their problem list.  Patient reports pain near her gallbladder. Reports it has been going on throughout the pregnancy, worsening recently. Worse with eating fast food.   Contractions: Not present. Vag. Bleeding: None.  Movement: Present. Denies any leaking of fluid.   The following portions of the patient's history were reviewed and updated as appropriate: allergies, current medications, past family history, past medical history, past social history, past surgical history and problem list.   Objective:   Vitals:   08/03/18 1103  BP: 107/66  Pulse: 80    Fetal Status:     Movement: Present     General:  Alert, oriented and cooperative. Patient is in no acute distress.  Respiratory: Normal respiratory effort, no problems with respiration noted  Mental Status: Normal mood and affect. Normal behavior. Normal  judgment and thought content.  Rest of physical exam deferred due to type of encounter   Assessment and Plan:  Pregnancy: G3P1011 at 5945w3d  1. Gestational diabetes mellitus (GDM) affecting pregnancy Currently diet controlled FG: 95-100 PP: 109-127 - will start metformin 500 mg QHS - has testing scheduled due to IUGR  2. Supervision of high risk pregnancy, antepartum  3. History of gestational diabetes  4. Intrauterine growth restriction (IUGR) affecting care of mother, third trimester, single or unspecified fetus - Reviewed IUGR, need for close follow up  - Patient verbalizes understanding  5. Gallbladder pain Will improve diet and see if pain is improves - may need US, has no formal diagnosis of gallstones   Preterm labor symptoms and general obstetric precautions including but not limited to vaginal bleeding, contractions, leaking of fluid and fetal movement were reviewed in detail with the patient. I discussed the assessment and treatment plan with the patient. The patient was provided an opportunity to ask questions and all were answered. The patient agreed with the plan and demonstrated an understanding of the instructions. The patient was advised to call back or seek an in-person office evaluation/[facility] for any urgent or concerning symptoms. Please refer to After Visit Summary for other counseling recommendations.   I provided 18 minutes of face-to-face time during this encounter.  Return in about 2 weeks (around 08/17/2018) for OB visit (MD), virtual.  Future Appointments  Date Time Provider Department Center  08/10/2018  2:45 PM WH-MFC US 5 WH-MFCUS MFC-US  08/10/2018  3:15 PM WH-MFC NST WH-MFC MFC-US  08/16/2018 12:45 PM WH-MFC US 2 WH-MFCUS MFC-US  08/16/2018  1:15 PM WH-MFC NST WH-MFC MFC-US  08/23/2018 10:15 AM WH-MFC [place] 4 WH-MFCUS MFC-US  08/23/2018 10:30 AM WH-MFC NST [place] MFC-US    Sloan Leiter, [place] for [place], [place]

## 2018-08-03 NOTE — Progress Notes (Signed)
I connected with  Loyalty Arentz Amezquita on 08/03/18 at 11:15 AM EDT by telephone/ MyChart and verified that I am speaking with the correct person using two identifiers.   I discussed the limitations, risks, security and privacy concerns of performing an evaluation and management service by telephone and the availability of in person appointments. I also discussed with the patient that there may be a patient responsible charge related to this service. The patient expressed understanding and agreed to proceed.  [place], Ali Molina 08/03/2018  11:04 AM

## 2018-08-10 ENCOUNTER — Ambulatory Visit (HOSPITAL_COMMUNITY): Payer: Self-pay | Admitting: *Deleted

## 2018-08-10 ENCOUNTER — Ambulatory Visit (HOSPITAL_COMMUNITY)
Admission: RE | Admit: 2018-08-10 | Discharge: 2018-08-10 | Disposition: A | Discharge status: Home / Self Care | Payer: Self-pay | Source: Ambulatory Visit | Attending: Obstetrics and Gynecology | Admitting: Obstetrics and Gynecology

## 2018-08-10 ENCOUNTER — Encounter (HOSPITAL_COMMUNITY): Payer: Self-pay

## 2018-08-10 ENCOUNTER — Other Ambulatory Visit: Payer: Self-pay

## 2018-08-10 DIAGNOSIS — O365990 Maternal care for other known or suspected poor fetal growth, unspecified trimester, not applicable or unspecified: Secondary | ICD-10-CM | POA: Insufficient documentation

## 2018-08-10 DIAGNOSIS — Z3A32 32 weeks gestation of pregnancy: Secondary | ICD-10-CM

## 2018-08-10 DIAGNOSIS — O365930 Maternal care for other known or suspected poor fetal growth, third trimester, not applicable or unspecified: Secondary | ICD-10-CM | POA: Insufficient documentation

## 2018-08-10 DIAGNOSIS — O24410 Gestational diabetes mellitus in pregnancy, diet controlled: Secondary | ICD-10-CM

## 2018-08-10 DIAGNOSIS — O24419 Gestational diabetes mellitus in pregnancy, unspecified control: Secondary | ICD-10-CM

## 2018-08-10 DIAGNOSIS — O09293 Supervision of pregnancy with other poor reproductive or obstetric history, third trimester: Secondary | ICD-10-CM

## 2018-08-10 DIAGNOSIS — O26843 Uterine size-date discrepancy, third trimester: Secondary | ICD-10-CM

## 2018-08-10 NOTE — Procedures (Signed)
Colleen Rose [birth year] [redacted]w[redacted]d  Fetus A Non-Stress Test Interpretation for 08/10/18  Indication: IUGR  Fetal Heart Rate A Mode: External Baseline Rate (A): 140 bpm Variability: Moderate Accelerations: 10 x 10, 15 x 15 Decelerations: None Multiple birth?: No  Uterine Activity Mode: Palpation, Toco Contraction Frequency (min): none Resting Tone Palpated: Relaxed Resting Time: Adequate  Interpretation (Fetal Testing) Nonstress Test Interpretation: Reactive Comments: Reviewed tracing with Dr. Gertie Exon

## 2018-08-16 ENCOUNTER — Ambulatory Visit (HOSPITAL_COMMUNITY): Payer: Self-pay | Admitting: *Deleted

## 2018-08-16 ENCOUNTER — Ambulatory Visit (HOSPITAL_COMMUNITY)
Admission: RE | Admit: 2018-08-16 | Discharge: 2018-08-16 | Disposition: A | Discharge status: Home / Self Care | Payer: Self-pay | Source: Ambulatory Visit | Attending: Obstetrics and Gynecology | Admitting: Obstetrics and Gynecology

## 2018-08-16 ENCOUNTER — Other Ambulatory Visit: Payer: Self-pay

## 2018-08-16 ENCOUNTER — Encounter (HOSPITAL_COMMUNITY): Payer: Self-pay

## 2018-08-16 VITALS — Temp 98.2°F

## 2018-08-16 DIAGNOSIS — O365990 Maternal care for other known or suspected poor fetal growth, unspecified trimester, not applicable or unspecified: Secondary | ICD-10-CM | POA: Insufficient documentation

## 2018-08-16 DIAGNOSIS — O365930 Maternal care for other known or suspected poor fetal growth, third trimester, not applicable or unspecified: Secondary | ICD-10-CM

## 2018-08-16 DIAGNOSIS — O09293 Supervision of pregnancy with other poor reproductive or obstetric history, third trimester: Secondary | ICD-10-CM

## 2018-08-16 DIAGNOSIS — Z3A33 33 weeks gestation of pregnancy: Secondary | ICD-10-CM

## 2018-08-16 DIAGNOSIS — O24410 Gestational diabetes mellitus in pregnancy, diet controlled: Secondary | ICD-10-CM

## 2018-08-16 NOTE — Procedures (Signed)
Colleen Rose 05-17-1995 [redacted]w[redacted]d  Fetus A Non-Stress Test Interpretation for 08/16/18  Indication: IUGR  Fetal Heart Rate A Mode: External Baseline Rate (A): 140 bpm Variability: Moderate Accelerations: 15 x 15 Decelerations: None Multiple birth?: No  Uterine Activity Mode: Toco Contraction Frequency (min): occ UC noted Contraction Duration (sec): 60-90 Contraction Quality: Mild Resting Tone Palpated: Relaxed Resting Time: Adequate  Interpretation (Fetal Testing) Nonstress Test Interpretation: Reactive Comments: FHR tracing rev'd by Dr. Gertie Exon

## 2018-08-17 ENCOUNTER — Other Ambulatory Visit (HOSPITAL_COMMUNITY): Payer: Self-pay | Admitting: *Deleted

## 2018-08-17 DIAGNOSIS — O365990 Maternal care for other known or suspected poor fetal growth, unspecified trimester, not applicable or unspecified: Secondary | ICD-10-CM

## 2018-08-19 ENCOUNTER — Telehealth (INDEPENDENT_AMBULATORY_CARE_PROVIDER_SITE_OTHER): Payer: Self-pay | Admitting: Obstetrics & Gynecology

## 2018-08-19 ENCOUNTER — Other Ambulatory Visit: Payer: Self-pay

## 2018-08-19 DIAGNOSIS — O365930 Maternal care for other known or suspected poor fetal growth, third trimester, not applicable or unspecified: Secondary | ICD-10-CM

## 2018-08-19 DIAGNOSIS — O0993 Supervision of high risk pregnancy, unspecified, third trimester: Secondary | ICD-10-CM

## 2018-08-19 DIAGNOSIS — Z3A33 33 weeks gestation of pregnancy: Secondary | ICD-10-CM

## 2018-08-19 DIAGNOSIS — O0990 Supervision of high risk pregnancy, unspecified, unspecified trimester: Secondary | ICD-10-CM

## 2018-08-19 DIAGNOSIS — O24419 Gestational diabetes mellitus in pregnancy, unspecified control: Secondary | ICD-10-CM

## 2018-08-19 NOTE — Progress Notes (Signed)
TELEHEALTH OBSTETRICS PRENATAL VIRTUAL VIDEO VISIT ENCOUNTER NOTE  Provider location: [facility]   I connected with Colleen Rose on 08/19/18 at 11:15 AM EDT by MyChart Video Encounter at home and verified that I am speaking with the correct person using two identifiers.   I discussed the limitations, risks, security and privacy concerns of performing an evaluation and management service virtually and the availability of in person appointments. I also discussed with the patient that there may be a patient responsible charge related to this service. The patient expressed understanding and agreed to proceed. Subjective:  Colleen Rose is a 23 y.o. G3P1011 at [redacted]w[redacted]d being seen today for ongoing prenatal care.  She is currently monitored for the following issues for this high-risk pregnancy and has Supervision of high risk pregnancy, antepartum; Maternal varicella, non-immune; History of gestational diabetes; Gestational diabetes mellitus (GDM) affecting pregnancy; and IUGR (intrauterine growth restriction) affecting care of mother on their problem list.  Patient reports no complaints.  Contractions: Not present. Vag. Bleeding: None.  Movement: Present. Denies any leaking of fluid.   The following portions of the patient's history were reviewed and updated as appropriate: allergies, current medications, past family history, past medical history, past social history, past surgical history and problem list.   Objective:  There were no vitals filed for this visit.  Fetal Status:     Movement: Present     General:  Alert, oriented and cooperative. Patient is in no acute distress.  Respiratory: Normal respiratory effort, no problems with respiration noted  Mental Status: Normal mood and affect. Normal behavior. Normal judgment and thought content.  Rest of physical exam deferred due to type of encounter  Imaging: [place] Mfm Fetal Bpp  W/nonstress  Result Date: 08/17/2018 ----------------------------------------------------------------------  OBSTETRICS REPORT                        (Signed Final 08/17/2018 07:21 am) ---------------------------------------------------------------------- Patient Info  ID #:       [id number]                          D.O.B.:  07-16-1995 (23 yrs)  Name:       Colleen Rose                  Visit Date: 08/16/2018 01:00 pm              Rose ---------------------------------------------------------------------- Performed By  Performed By:     Eden Lathe BS      Ref. Address:      [address]                    [address]                                                              Road  [place], [place]                                                              [address]  Attending:        Lin Landsman      [facility]                    MD                                        [facility]  Referred By:      Reva Bores                    MD ---------------------------------------------------------------------- Orders   #  Description                          Code         Ordered By   1  [place] MFM FETAL BPP                     [phone]      [facility]      W/NONSTRESS   2  [place] MFM UA CORD DOPPLER               76820.02     [facility]  ----------------------------------------------------------------------   #  Order #                    Accession #                 Episode #   1  [id number]                  [id number]                  [id number]   2  [id number]                  [id number]                  [id number]  ---------------------------------------------------------------------- Indications   Encounter for other antenatal screening        Z36.2   follow-up   Gestational diabetes in pregnancy, diet        O24.410   controlled   Poor obstetric history: Previous gestational   O09.299   diabetes   Maternal care for known or  suspected poor      O36.5930   fetal growth, third trimester, not applicable or   unspecified   [redacted] weeks gestation of pregnancy                Z3A.33  ---------------------------------------------------------------------- Vital Signs                                                 Height:        5'1" ---------------------------------------------------------------------- Fetal Evaluation  Num Of Fetuses:  1  Fetal Heart Rate(bpm):   144  Cardiac Activity:        Observed  Presentation:            Cephalic  Amniotic Fluid  AFI FV:      Within normal limits  AFI Sum(cm)     %Tile       Largest Pocket(cm)  11.44           29          4.16  RUQ(cm)       RLQ(cm)       LUQ(cm)        LLQ(cm)  3.8           1.53          1.95           4.16 ---------------------------------------------------------------------- Biophysical Evaluation  Amniotic F.V:   Within normal limits       F. Tone:         Observed  F. Movement:    Observed                   Score:           8/8  F. Breathing:   Observed ---------------------------------------------------------------------- OB History  Gravidity:    3         Term:   1         SAB:   1  Living:       1 ---------------------------------------------------------------------- Gestational Age  LMP:           33w 2d        Date:  12/26/17                 EDD:   10/02/18  Best:          33w 2d     Det. By:  LMP  (12/26/17)          EDD:   10/02/18 ---------------------------------------------------------------------- Doppler - Fetal Vessels  Umbilical Artery   S/D     %tile     RI              PI                     ADFV    RDFV  2.72       58   0.63             0.95                        No      No ---------------------------------------------------------------------- Impression  Known IUGR  BPP 8/8  Normal UA Dopplers ---------------------------------------------------------------------- Recommendations  Continue weekly testing with UA Dopplers.  ----------------------------------------------------------------------               Lin Landsmanorenthian Booker, MD Electronically Signed Final Report   08/17/2018 07:21 am ----------------------------------------------------------------------  [place] Mfm Fetal Bpp W/nonstress  Result Date: 08/10/2018 ----------------------------------------------------------------------  OBSTETRICS REPORT                       (Signed Final 08/10/2018 05:35 pm) ---------------------------------------------------------------------- Patient Info  ID #:       [id number]                          D.O.B.:  [birth year] (22 yrs)  Name:       Colleen Rose  Visit Date: 08/10/2018 03:58 pm              Rose ---------------------------------------------------------------------- Performed By  Performed By:     Eden Lathe BS      Ref. Address:     [address]                    [address] [address]                                                             [address]                                                             [place], [place]                                                             [address]  Attending:        Lin Landsman      [facility]                    MD                                       Fetal Care  Referred By:      Reva Bores                    MD ---------------------------------------------------------------------- Orders   #  Description                          Code         Ordered By   1  [place] MFM FETAL BPP                     60454.0      [facility]      W/NONSTRESS   2  [place] MFM UA CORD DOPPLER               98119.14     [facility]  ----------------------------------------------------------------------   #  Order #                    Accession #                 Episode #   1  [id number]                  [id number]                  [id number]   2  [id number]                  [id number]  [id number]   ---------------------------------------------------------------------- Indications   Size-Date Discrepancy (size < dates on         O26.849   original [place] @ [facility])   Gestational diabetes in pregnancy, diet        O24.410   controlled   Poor obstetric history: Previous gestational   O09.299   diabetes   Maternal care for known or suspected poor      O36.5930   fetal growth, third trimester, not applicable or   unspecified   [redacted] weeks gestation of pregnancy                Z3A.32  ---------------------------------------------------------------------- Vital Signs                                                 Height:        5'1" ---------------------------------------------------------------------- Fetal Evaluation  Num Of Fetuses:         1  Fetal Heart Rate(bpm):  161  Cardiac Activity:       Observed  Presentation:           Cephalic  Amniotic Fluid  AFI FV:      Subjectively decreased  AFI Sum(cm)     %Tile       Largest Pocket(cm)  7.34            < 3         4.22  RUQ(cm)                     LUQ(cm)  3.12                        4.22 ---------------------------------------------------------------------- Biophysical Evaluation  Amniotic F.V:   Pocket => 2 cm two         F. Tone:        Observed                  planes  F. Movement:    Observed                   Score:          6/8  F. Breathing:   Not Observed ---------------------------------------------------------------------- OB History  Gravidity:    3         Term:   1         SAB:   1  Living:       1 ---------------------------------------------------------------------- Gestational Age  LMP:           32w 3d        Date:  12/26/17                 EDD:   10/02/18  Best:          Armida Sans 3d     Det. By:  LMP  (12/26/17)          EDD:   10/02/18 ---------------------------------------------------------------------- Doppler - Fetal Vessels  Umbilical Artery   S/D     %tile                                            ADFV    RDFV  2.67  52                                                 No      No ---------------------------------------------------------------------- Impression  Biophysical profile 8/10  NST - Reactive  Fetal growth restriction ---------------------------------------------------------------------- Recommendations  Initiate weekly BPP/NST with UA dopplers  Repeat growth in 3 weeks. ----------------------------------------------------------------------               Lin Landsmanorenthian Booker, MD Electronically Signed Final Report   08/10/2018 05:35 pm ----------------------------------------------------------------------  [place] Mfm Ob Detail +14 Wk  Result Date: 08/01/2018 ----------------------------------------------------------------------  OBSTETRICS REPORT                       (Signed Final 08/01/2018 04:38 pm) ---------------------------------------------------------------------- Patient Info  ID #:       [id number]                          D.O.B.:  1996/01/16 (22 yrs)  Name:       Colleen Rose                  Visit Date: 08/01/2018 02:29 pm              Rose ---------------------------------------------------------------------- Performed By  Performed By:     Tomma Lightningevin Vics             Ref. Address:     [address]                    [address]                                                             Road                                                             [place], [place]                                                             [id number]  Attending:        Noralee Spaceavi Shankar MD        [facility]                                                             [facility]  Referred By:      Reva BoresANYA S PRATT  MD ---------------------------------------------------------------------- Orders   #  Description                          Code         Ordered By   1  [place] MFM OB DETAIL +14 WK              76811.01     TANYA PRATT   2  [place] MFM UA CORD DOPPLER               76820.02     TANYA PRATT   ----------------------------------------------------------------------   #  Order #                    Accession #                 Episode #   1  [id number]                  [id number]                  [id number]   2  [id number]                  [id number]                  [id number]  ---------------------------------------------------------------------- Indications   Size-Date Discrepancy (size < dates on         O26.849   original [place] @ [facility])   [redacted] weeks gestation of pregnancy                Z3A.31   Encounter for antenatal screening for          Z36.3   malformations   Gestational diabetes in pregnancy, diet        O24.410   controlled   Poor obstetric history: Previous gestational   O09.299   diabetes   Maternal care for known or suspected poor      O36.5930   fetal growth, third trimester, not applicable or   unspecified  ---------------------------------------------------------------------- Vital Signs  Weight (lb): 150                               Height:        5'1"  BMI:         28.34 ---------------------------------------------------------------------- Fetal Evaluation  Num Of Fetuses:         1  Fetal Heart Rate(bpm):  161  Cardiac Activity:       Observed  Presentation:           Cephalic  Placenta:               Anterior  P. Cord Insertion:      Visualized  Amniotic Fluid  AFI FV:      Within normal limits  AFI Sum(cm)     %Tile       Largest Pocket(cm)  13.91           46          4.89  RUQ(cm)       RLQ(cm)       LUQ(cm)        LLQ(cm)  4.89          3.28          1.78  3.96 ---------------------------------------------------------------------- Biometry  BPD:      71.5  mm     G. Age:  28w 5d          1  %    CI:        73.78   %    70 - 86                                                          FL/HC:      20.4   %    19.3 - 21.3  HC:      264.4  mm     G. Age:  28w 5d        < 1  %    HC/AC:      1.03        0.96 - 1.17  AC:      256.7  mm     G. Age:  29w 6d         13  %    FL/BPD:      75.4   %    71 - 87  FL:       53.9  mm     G. Age:  28w 4d        < 1  %    FL/AC:      21.0   %    20 - 24  HUM:      49.7  mm     G. Age:  29w 1d         10  %  LV:        3.8  mm  Est. FW:    1358  gm           3 lb      3  % ---------------------------------------------------------------------- OB History  Gravidity:    3         Term:   1         SAB:   1  Living:       1 ---------------------------------------------------------------------- Gestational Age  LMP:           31w 1d        Date:  12/26/17                 EDD:   10/02/18  U/S Today:     29w 0d                                        EDD:   10/17/18  Best:          31w 1d     Det. By:  LMP  (12/26/17)          EDD:   10/02/18 ---------------------------------------------------------------------- Anatomy  Cranium:               Appears normal         LVOT:                   Appears normal  Cavum:                 Appears normal  Aortic Arch:            Not well visualized  Ventricles:            Appears normal         Ductal Arch:            Not well visualized  Choroid Plexus:        Appears normal         Diaphragm:              Appears normal  Cerebellum:            Appears normal         Stomach:                Appears normal, left                                                                        sided  Posterior Fossa:       Appears normal         Abdomen:                Appears normal  Nuchal Fold:           Not applicable (>20    Abdominal Wall:         Not well visualized                         wks GA)  Face:                  Appears normal         Cord Vessels:           Appears normal (3                         (orbits and profile)                           vessel cord)  Lips:                  Appears normal         Kidneys:                Appear normal  Palate:                Not well visualized    Bladder:                Appears normal  Thoracic:              Appears normal         Spine:                  Ltd views no  intracranial signs of                                                                        NTD  Heart:                 Appears normal         Upper Extremities:      Not well visualized                         (4CH, axis, and                         situs)  RVOT:                  Not well visualized    Lower Extremities:      Appears normal  Other:  Fetus appears to be a female.  Heels visualized. Nasal bone visualized.          Technically difficult due to maternal habitus and fetal position. ---------------------------------------------------------------------- Doppler - Fetal Vessels  Umbilical Artery   S/D     %tile                                            ADFV    RDFV  2.27       21                                                No      No ---------------------------------------------------------------------- Cervix Uterus Adnexa  Cervix  Not visualized (advanced GA >24wks) ---------------------------------------------------------------------- Impression  Gestational diabetes. Controlled on diet.  On ultrasound, the estimated fetal weight is at the 3rd  percentile. Head circumference measurement is at between -  2 and -1 SD (not consistent with microcephaly). Amniotic fluid  is normal and good fetal activity is seen. Umbilical artery  Doppler, performed because of fetal growth restriction,  showed normal forward diastolic flow. Fetal anatomy appears  normal, but limited by advanced gestational age.  I explained the findings consistent with fetal growth  restriction. I also informed her that because it is difficult to  differentiate between growth restriction and a constitutionally  small fetus, we recommend close fetal surveillance till  delivery. ---------------------------------------------------------------------- Recommendations  -Weekly BPP, NST and UA Doppler studies till delivery.  -Fetal growth assessment in 3 weeks.  ----------------------------------------------------------------------                  Noralee Spaceavi Shankar, MD Electronically Signed Final Report   08/01/2018 04:38 pm ----------------------------------------------------------------------  [place] Mfm Ua Cord Doppler  Result Date: 08/17/2018 ----------------------------------------------------------------------  OBSTETRICS REPORT                        (Signed Final 08/17/2018 07:21 am) ---------------------------------------------------------------------- Patient Info  ID #:       [id number]  D.O.B.:  07-14-1995 (22 yrs)  Name:       Colleen Rose                  Visit Date: 08/16/2018 01:00 pm              Rose ---------------------------------------------------------------------- Performed By  Performed By:     Eden Lathe BS      Ref. Address:      [address]                    [address]                                                              [address]                                                              [place], [place]                                                              [address]  Attending:        Lin Landsman      [facility]                    MD                                        [facility]etal Care  Referred By:      Reva Bores                    MD ---------------------------------------------------------------------- Orders   #  Description                          Code         Ordered By   1  [place] MFM FETAL BPP                     60454.0      [facility]      W/NONSTRESS   2  [place] MFM UA CORD DOPPLER               98119.14     [facility]  ----------------------------------------------------------------------   #  Order #                    Accession #                 Episode #   1  [id number]                  [id number]  [id number]   2  [id number]                  [id number]                  [id number]   ---------------------------------------------------------------------- Indications   Encounter for other antenatal screening        Z36.2   follow-up   Gestational diabetes in pregnancy, diet        O24.410   controlled   Poor obstetric history: Previous gestational   O09.299   diabetes   Maternal care for known or suspected poor      O36.5930   fetal growth, third trimester, not applicable or   unspecified   [redacted] weeks gestation of pregnancy                Z3A.33  ---------------------------------------------------------------------- Vital Signs                                                 Height:        5'1" ---------------------------------------------------------------------- Fetal Evaluation  Num Of Fetuses:          1  Fetal Heart Rate(bpm):   144  Cardiac Activity:        Observed  Presentation:            Cephalic  Amniotic Fluid  AFI FV:      Within normal limits  AFI Sum(cm)     %Tile       Largest Pocket(cm)  11.44           29          4.16  RUQ(cm)       RLQ(cm)       LUQ(cm)        LLQ(cm)  3.8           1.53          1.95           4.16 ---------------------------------------------------------------------- Biophysical Evaluation  Amniotic F.V:   Within normal limits       F. Tone:         Observed  F. Movement:    Observed                   Score:           8/8  F. Breathing:   Observed ---------------------------------------------------------------------- OB History  Gravidity:    3         Term:   1         SAB:   1  Living:       1 ---------------------------------------------------------------------- Gestational Age  LMP:           33w 2d        Date:  12/26/17                 EDD:   10/02/18  Best:          33w 2d     Det. By:  LMP  (12/26/17)          EDD:   10/02/18 ---------------------------------------------------------------------- Doppler - Fetal Vessels  Umbilical Artery   S/D     %tile     RI              PI  ADFV    RDFV  2.72       58   0.63             0.95                         No      No ---------------------------------------------------------------------- Impression  Known IUGR  BPP 8/8  Normal UA Dopplers ---------------------------------------------------------------------- Recommendations  Continue weekly testing with UA Dopplers. ----------------------------------------------------------------------               Lin Landsman, MD Electronically Signed Final Report   08/17/2018 07:21 am ----------------------------------------------------------------------  [place] Mfm Ua Cord Doppler  Result Date: 08/10/2018 ----------------------------------------------------------------------  OBSTETRICS REPORT                       (Signed Final 08/10/2018 05:35 pm) ---------------------------------------------------------------------- Patient Info  ID #:       [id number]                          D.O.B.:  10/12/1995 (22 yrs)  Name:       Colleen Rose                  Visit Date: 08/10/2018 03:58 pm              Rose ---------------------------------------------------------------------- Performed By  Performed By:     Eden Lathe BS      Ref. Address:     [address]                    [address]                                                             [address]                                                             [place], [place]                                                             [address]  Attending:        Lin Landsman      [facility]                    MD                                       Fetal Care  Referred By:      Reva Bores                    MD ---------------------------------------------------------------------- Orders   #  Description  Code         Ordered By   1  [place] MFM FETAL BPP                     [id number]      [facility]      W/NONSTRESS   2  [place] MFM UA CORD DOPPLER               [id number]     [facility]  ----------------------------------------------------------------------   #   Order #                    Accession #                 Episode #   1  [id number]                  [id number]                  [id number]   2  [id number]                  [id number]                  [id number]  ---------------------------------------------------------------------- Indications   Size-Date Discrepancy (size < dates on         O26.849   original [place] @ [facility])   Gestational diabetes in pregnancy, diet        O24.410   controlled   Poor obstetric history: Previous gestational   O09.299   diabetes   Maternal care for known or suspected poor      O36.5930   fetal growth, third trimester, not applicable or   unspecified   [redacted] weeks gestation of pregnancy                Z3A.32  ---------------------------------------------------------------------- Vital Signs                                                 Height:        5'1" ---------------------------------------------------------------------- Fetal Evaluation  Num Of Fetuses:         1  Fetal Heart Rate(bpm):  161  Cardiac Activity:       Observed  Presentation:           Cephalic  Amniotic Fluid  AFI FV:      Subjectively decreased  AFI Sum(cm)     %Tile       Largest Pocket(cm)  7.34            < 3         4.22  RUQ(cm)                     LUQ(cm)  3.12                        4.22 ---------------------------------------------------------------------- Biophysical Evaluation  Amniotic F.V:   Pocket => 2 cm two         F. Tone:        Observed                  planes  F. Movement:    Observed  Score:          6/8  F. Breathing:   Not Observed ---------------------------------------------------------------------- OB History  Gravidity:    3         Term:   1         SAB:   1  Living:       1 ---------------------------------------------------------------------- Gestational Age  LMP:           32w 3d        Date:  12/26/17                 EDD:   10/02/18  Best:          Armida Sans 3d     Det. By:  LMP  (12/26/17)          EDD:   10/02/18  ---------------------------------------------------------------------- Doppler - Fetal Vessels  Umbilical Artery   S/D     %tile                                            ADFV    RDFV  2.67       52                                                No      No ---------------------------------------------------------------------- Impression  Biophysical profile 8/10  NST - Reactive  Fetal growth restriction ---------------------------------------------------------------------- Recommendations  Initiate weekly BPP/NST with UA dopplers  Repeat growth in 3 weeks. ----------------------------------------------------------------------               Lin Landsman, MD Electronically Signed Final Report   08/10/2018 05:35 pm ----------------------------------------------------------------------  [place] Mfm Ua Cord Doppler  Result Date: 08/01/2018 ----------------------------------------------------------------------  OBSTETRICS REPORT                       (Signed Final 08/01/2018 04:38 pm) ---------------------------------------------------------------------- Patient Info  ID #:       [id number]                          D.O.B.:  [birth year] (22 yrs)  Name:       Colleen Rose                  Visit Date: 08/01/2018 02:29 pm              Rose ---------------------------------------------------------------------- Performed By  Performed By:     Tomma Lightning             Ref. Address:     [address]                    [facility]                                                             [address]  [place], [place]                                                             [address]  Attending:        Noralee Space MD        [facility]  Referred By:      Reva Bores                    MD ---------------------------------------------------------------------- Orders   #   Description                          Code         Ordered By   1  [place] MFM OB DETAIL +14 WK              76811.01     Tinnie Gens   2  [place] MFM UA CORD DOPPLER               76820.02     TANYA PRATT  ----------------------------------------------------------------------   #  Order #                    Accession #                 Episode #   1  [id number]   2  [id number]  ---------------------------------------------------------------------- Indications   Size-Date Discrepancy (size < dates on         O26.849   original [place] @ [facility])   [redacted] weeks gestation of pregnancy                Z3A.31   Encounter for antenatal screening for          Z36.3   malformations   Gestational diabetes in pregnancy, diet        O24.410   controlled   Poor obstetric history: Previous gestational   O09.299   diabetes   Maternal care for known or suspected poor      O36.5930   fetal growth, third trimester, not applicable or   unspecified  ---------------------------------------------------------------------- Vital Signs  Weight (lb): 150                               Height:        5'1"  BMI:  28.34 ---------------------------------------------------------------------- Fetal Evaluation  Num Of Fetuses:         1  Fetal Heart Rate(bpm):  161  Cardiac Activity:       Observed  Presentation:           Cephalic  Placenta:               Anterior  P. Cord Insertion:      Visualized  Amniotic Fluid  AFI FV:      Within normal limits  AFI Sum(cm)     %Tile       Largest Pocket(cm)  13.91           46          4.89  RUQ(cm)       RLQ(cm)       LUQ(cm)        LLQ(cm)  4.89          3.28          1.78           3.96 ---------------------------------------------------------------------- Biometry  BPD:      71.5  mm     G. Age:  28w 5d          1  %    CI:        73.78   %    70 - 86                                                           FL/HC:      20.4   %    19.3 - 21.3  HC:      264.4  mm     G. Age:  28w 5d        < 1  %    HC/AC:      1.03        0.96 - 1.17  AC:      256.7  mm     G. Age:  29w 6d         13  %    FL/BPD:     75.4   %    71 - 87  FL:       53.9  mm     G. Age:  28w 4d        < 1  %    FL/AC:      21.0   %    20 - 24  HUM:      49.7  mm     G. Age:  29w 1d         10  %  LV:        3.8  mm  Est. FW:    1358  gm           3 lb      3  % ---------------------------------------------------------------------- OB History  Gravidity:    3         Term:   1         SAB:   1  Living:       1 ---------------------------------------------------------------------- Gestational Age  LMP:           31w 1d        Date:  12/26/17  EDD:   10/02/18  U/S Today:     29w 0d                                        EDD:   10/17/18  Best:          31w 1d     Det. By:  LMP  (12/26/17)          EDD:   10/02/18 ---------------------------------------------------------------------- Anatomy  Cranium:               Appears normal         LVOT:                   Appears normal  Cavum:                 Appears normal         Aortic Arch:            Not well visualized  Ventricles:            Appears normal         Ductal Arch:            Not well visualized  Choroid Plexus:        Appears normal         Diaphragm:              Appears normal  Cerebellum:            Appears normal         Stomach:                Appears normal, left                                                                        sided  Posterior Fossa:       Appears normal         Abdomen:                Appears normal  Nuchal Fold:           Not applicable (>20    Abdominal Wall:         Not well visualized                         wks GA)  Face:                  Appears normal         Cord Vessels:           Appears normal (3                         (orbits and profile)                           vessel cord)  Lips:                  Appears normal         Kidneys:  Appear normal  Palate:                Not well visualized    Bladder:                Appears normal  Thoracic:              Appears normal         Spine:                  Ltd views no                                                                        intracranial signs of                                                                        NTD  Heart:                 Appears normal         Upper Extremities:      Not well visualized                         (4CH, axis, and                         situs)  RVOT:                  Not well visualized    Lower Extremities:      Appears normal  Other:  Fetus appears to be a female.  Heels visualized. Nasal bone visualized.          Technically difficult due to maternal habitus and fetal position. ---------------------------------------------------------------------- Doppler - Fetal Vessels  Umbilical Artery   S/D     %tile                                            ADFV    RDFV  2.27       21                                                No      No ---------------------------------------------------------------------- Cervix Uterus Adnexa  Cervix  Not visualized (advanced GA >24wks) ---------------------------------------------------------------------- Impression  Gestational diabetes. Controlled on diet.  On ultrasound, the estimated fetal weight is at the 3rd  percentile. Head circumference measurement is at between -  2 and -1 SD (not consistent with microcephaly). Amniotic fluid  is normal and good fetal activity is seen. Umbilical artery  Doppler, performed because of fetal growth restriction,  showed normal forward diastolic flow. Fetal anatomy appears  normal,  but limited by advanced gestational age.  I explained the findings consistent with fetal growth  restriction. I also informed her that because it is difficult to  differentiate between growth restriction and a constitutionally  small fetus, we recommend close fetal surveillance till  delivery.  ---------------------------------------------------------------------- Recommendations  -Weekly BPP, NST and UA Doppler studies till delivery.  -Fetal growth assessment in 3 weeks. ----------------------------------------------------------------------                  Noralee Space, MD Electronically Signed Final Report   08/01/2018 04:38 pm ----------------------------------------------------------------------   Assessment and Plan:  Pregnancy: G3P1011 at [redacted]w[redacted]d   2. Gestational diabetes mellitus (GDM) affecting pregnancy - taking metformin 500mg  qhs, started 2 days ago - fasting yesterday 100 and today 97 - 2 hours yesterday all less than 120  3. Supervision of high risk pregnancy, antepartum   4. Intrauterine growth restriction (IUGR) affecting care of mother, third trimester, single or unspecified fetus - weekly BPP and dopplers with MFM  Preterm labor symptoms and general obstetric precautions including but not limited to vaginal bleeding, contractions, leaking of fluid and fetal movement were reviewed in detail with the patient. I discussed the assessment and treatment plan with the patient. The patient was provided an opportunity to ask questions and all were answered. The patient agreed with the plan and demonstrated an understanding of the instructions. The patient was advised to call back or seek an in-person office evaluation/go to [facility] at [facility] for any urgent or concerning symptoms. Please refer to After Visit Summary for other counseling recommendations.   I provided 10 minutes of face-to-face time during this encounter.  No follow-ups on file.  Future Appointments  Date Time Provider Department Center  08/23/2018 10:15 AM [facility]-MFC NURSE WH-MFC MFC-US  08/23/2018 10:15 AM WH-MFC [place] 4 WH-MFCUS MFC-US  08/23/2018 10:30 AM WH-MFC NST WH-MFC MFC-US  08/30/2018 11:15 AM WH-MFC NURSE WH-MFC MFC-US  08/30/2018 11:15 AM WH-MFC [place] 4 WH-MFCUS MFC-US  08/30/2018 12:00 PM  WH-MFC NST WH-MFC MFC-US  09/06/2018 11:15 AM WH-MFC NURSE WH-MFC MFC-US  09/06/2018 11:15 AM WH-MFC [place] 4 WH-MFCUS MFC-US  09/06/2018 12:00 PM WH-MFC NST WH-MFC MFC-US    Allie Bossier, MD [facility] [employer]

## 2018-08-19 NOTE — Progress Notes (Signed)
I connected with  Colleen Rose on 08/19/18 at 11:15 AM EDT by telephone and verified that I am speaking with the correct person using two identifiers.   I discussed the limitations, risks, security and privacy concerns of performing an evaluation and management service by telephone and the availability of in person appointments. I also discussed with the patient that there may be a patient responsible charge related to this service. The patient expressed understanding and agreed to proceed.  [place], CMA 08/19/2018  11:32 AM

## 2018-08-23 ENCOUNTER — Other Ambulatory Visit: Payer: Self-pay

## 2018-08-23 ENCOUNTER — Encounter: Payer: Self-pay | Admitting: Medical

## 2018-08-23 ENCOUNTER — Ambulatory Visit (HOSPITAL_COMMUNITY): Payer: Self-pay | Admitting: *Deleted

## 2018-08-23 ENCOUNTER — Other Ambulatory Visit (HOSPITAL_COMMUNITY): Payer: Self-pay | Admitting: *Deleted

## 2018-08-23 ENCOUNTER — Ambulatory Visit (INDEPENDENT_AMBULATORY_CARE_PROVIDER_SITE_OTHER): Payer: Self-pay | Admitting: Medical

## 2018-08-23 ENCOUNTER — Ambulatory Visit (HOSPITAL_BASED_OUTPATIENT_CLINIC_OR_DEPARTMENT_OTHER): Payer: Self-pay | Admitting: *Deleted

## 2018-08-23 ENCOUNTER — Encounter (HOSPITAL_COMMUNITY): Payer: Self-pay

## 2018-08-23 ENCOUNTER — Ambulatory Visit (HOSPITAL_COMMUNITY)
Admission: RE | Admit: 2018-08-23 | Discharge: 2018-08-23 | Disposition: A | Discharge status: Home / Self Care | Payer: Self-pay | Source: Ambulatory Visit | Attending: Obstetrics and Gynecology | Admitting: Obstetrics and Gynecology

## 2018-08-23 VITALS — BP 103/69 | HR 98 | Temp 97.9°F | Wt 164.9 lb

## 2018-08-23 DIAGNOSIS — O24419 Gestational diabetes mellitus in pregnancy, unspecified control: Secondary | ICD-10-CM

## 2018-08-23 DIAGNOSIS — O09899 Supervision of other high risk pregnancies, unspecified trimester: Secondary | ICD-10-CM

## 2018-08-23 DIAGNOSIS — O09293 Supervision of pregnancy with other poor reproductive or obstetric history, third trimester: Secondary | ICD-10-CM

## 2018-08-23 DIAGNOSIS — Z3A34 34 weeks gestation of pregnancy: Secondary | ICD-10-CM

## 2018-08-23 DIAGNOSIS — Z2839 Other underimmunization status: Secondary | ICD-10-CM

## 2018-08-23 DIAGNOSIS — O365930 Maternal care for other known or suspected poor fetal growth, third trimester, not applicable or unspecified: Secondary | ICD-10-CM

## 2018-08-23 DIAGNOSIS — Z362 Encounter for other antenatal screening follow-up: Secondary | ICD-10-CM

## 2018-08-23 DIAGNOSIS — O24415 Gestational diabetes mellitus in pregnancy, controlled by oral hypoglycemic drugs: Secondary | ICD-10-CM

## 2018-08-23 DIAGNOSIS — O26843 Uterine size-date discrepancy, third trimester: Secondary | ICD-10-CM

## 2018-08-23 DIAGNOSIS — O365990 Maternal care for other known or suspected poor fetal growth, unspecified trimester, not applicable or unspecified: Secondary | ICD-10-CM

## 2018-08-23 DIAGNOSIS — O0990 Supervision of high risk pregnancy, unspecified, unspecified trimester: Secondary | ICD-10-CM

## 2018-08-23 DIAGNOSIS — O26893 Other specified pregnancy related conditions, third trimester: Secondary | ICD-10-CM

## 2018-08-23 DIAGNOSIS — O24410 Gestational diabetes mellitus in pregnancy, diet controlled: Secondary | ICD-10-CM | POA: Insufficient documentation

## 2018-08-23 DIAGNOSIS — O09893 Supervision of other high risk pregnancies, third trimester: Secondary | ICD-10-CM

## 2018-08-23 DIAGNOSIS — O0993 Supervision of high risk pregnancy, unspecified, third trimester: Secondary | ICD-10-CM

## 2018-08-23 DIAGNOSIS — O26849 Uterine size-date discrepancy, unspecified trimester: Secondary | ICD-10-CM | POA: Insufficient documentation

## 2018-08-23 DIAGNOSIS — Z283 Underimmunization status: Secondary | ICD-10-CM

## 2018-08-23 DIAGNOSIS — R12 Heartburn: Secondary | ICD-10-CM

## 2018-08-23 NOTE — Patient Instructions (Signed)
Fetal Movement Counts Patient Name: ________________________________________________ Patient Due Date: ____________________ What is a fetal movement count?  A fetal movement count is the number of times that you feel your baby move during a certain amount of time. This may also be called a fetal kick count. A fetal movement count is recommended for every pregnant woman. You may be asked to start counting fetal movements as early as week 28 of your pregnancy. Pay attention to when your baby is most active. You may notice your baby's sleep and wake cycles. You may also notice things that make your baby move more. You should do a fetal movement count:  When your baby is normally most active.  At the same time each day. A good time to count movements is while you are resting, after having something to eat and drink. How do I count fetal movements? 1. Find a quiet, comfortable area. Sit, or lie down on your side. 2. Write down the date, the start time and stop time, and the number of movements that you felt between those two times. Take this information with you to your health care visits. 3. For 2 hours, count kicks, flutters, swishes, rolls, and jabs. You should feel at least 10 movements during 2 hours. 4. You may stop counting after you have felt 10 movements. 5. If you do not feel 10 movements in 2 hours, have something to eat and drink. Then, keep resting and counting for 1 hour. If you feel at least 4 movements during that hour, you may stop counting. Contact a health care provider if:  You feel fewer than 4 movements in 2 hours.  Your baby is not moving like he or she usually does. Date: ____________ Start time: ____________ Stop time: ____________ Movements: ____________ Date: ____________ Start time: ____________ Stop time: ____________ Movements: ____________ Date: ____________ Start time: ____________ Stop time: ____________ Movements: ____________ Date: ____________ Start time:  ____________ Stop time: ____________ Movements: ____________ Date: ____________ Start time: ____________ Stop time: ____________ Movements: ____________ Date: ____________ Start time: ____________ Stop time: ____________ Movements: ____________ Date: ____________ Start time: ____________ Stop time: ____________ Movements: ____________ Date: ____________ Start time: ____________ Stop time: ____________ Movements: ____________ Date: ____________ Start time: ____________ Stop time: ____________ Movements: ____________ This information is not intended to replace advice given to you by your health care provider. Make sure you discuss any questions you have with your health care provider. Document Released: 02/18/2006 Document Revised: 02/08/2018 Document Reviewed: 02/28/2015 Elsevier Patient Education  2020 [employer] Braxton Hicks Contractions Contractions of the uterus can occur throughout pregnancy, but they are not always a sign that you are in labor. You may have practice contractions called Braxton Hicks contractions. These false labor contractions are sometimes confused with true labor. What are Braxton Hicks contractions? Braxton Hicks contractions are tightening movements that occur in the muscles of the uterus before labor. Unlike true labor contractions, these contractions do not result in opening (dilation) and thinning of the cervix. Toward the end of pregnancy (32-34 weeks), Braxton Hicks contractions can happen more often and may become stronger. These contractions are sometimes difficult to tell apart from true labor because they can be very uncomfortable. You should not feel embarrassed if you go to the hospital with false labor. Sometimes, the only way to tell if you are in true labor is for your health care provider to look for changes in the cervix. The health care provider will do a physical exam and may monitor your contractions. If you   are not in true labor, the exam should show  that your cervix is not dilating and your water has not broken. If there are no other health problems associated with your pregnancy, it is completely safe for you to be sent home with false labor. You may continue to have Braxton Hicks contractions until you go into true labor. How to tell the difference between true labor and false labor True labor  Contractions last 30-70 seconds.  Contractions become very regular.  Discomfort is usually felt in the top of the uterus, and it spreads to the lower abdomen and low back.  Contractions do not go away with walking.  Contractions usually become more intense and increase in frequency.  The cervix dilates and gets thinner. False labor  Contractions are usually shorter and not as strong as true labor contractions.  Contractions are usually irregular.  Contractions are often felt in the front of the lower abdomen and in the groin.  Contractions may go away when you walk around or change positions while lying down.  Contractions get weaker and are shorter-lasting as time goes on.  The cervix usually does not dilate or become thin. Follow these instructions at home:   Take over-the-counter and prescription medicines only as told by your health care provider.  Keep up with your usual exercises and follow other instructions from your health care provider.  Eat and drink lightly if you think you are going into labor.  If Braxton Hicks contractions are making you uncomfortable: ? Change your position from lying down or resting to walking, or change from walking to resting. ? Sit and rest in a tub of warm water. ? Drink enough fluid to keep your urine pale yellow. Dehydration may cause these contractions. ? Do slow and deep breathing several times an hour.  Keep all follow-up prenatal visits as told by your health care provider. This is important. Contact a health care provider if:  You have a fever.  You have continuous pain in  your abdomen. Get help right away if:  Your contractions become stronger, more regular, and closer together.  You have fluid leaking or gushing from your vagina.  You pass blood-tinged mucus (bloody show).  You have bleeding from your vagina.  You have low back pain that you never had before.  You feel your baby's head pushing down and causing pelvic pressure.  Your baby is not moving inside you as much as it used to. Summary  Contractions that occur before labor are called Braxton Hicks contractions, false labor, or practice contractions.  Braxton Hicks contractions are usually shorter, weaker, farther apart, and less regular than true labor contractions. True labor contractions usually become progressively stronger and regular, and they become more frequent.  Manage discomfort from Braxton Hicks contractions by changing position, resting in a warm bath, drinking plenty of water, or practicing deep breathing. This information is not intended to replace advice given to you by your health care provider. Make sure you discuss any questions you have with your health care provider. Document Released: 06/04/2016 Document Revised: 01/01/2017 Document Reviewed: 06/04/2016 Elsevier Patient Education  2020 Elsevier Inc.  

## 2018-08-23 NOTE — Procedures (Signed)
[name] 11-13-1995 [redacted]w[redacted]d  Fetus A Non-Stress Test Interpretation for 08/23/18  Indication: IUGR  Fetal Heart Rate A Mode: External Baseline Rate (A): 140 bpm Variability: Moderate Accelerations: 15 x 15 Decelerations: None Multiple birth?: No  Uterine Activity Mode: Palpation, Toco Contraction Frequency (min): None Resting Tone Palpated: Relaxed Resting Time: Adequate  Interpretation (Fetal Testing) Nonstress Test Interpretation: Reactive Comments: EFM tracing reviewed by Dr. Gertie Exon

## 2018-08-23 NOTE — Progress Notes (Signed)
Pt states records Glucose on paper log & also BRx.

## 2018-08-23 NOTE — Progress Notes (Signed)
PRENATAL VISIT NOTE  Subjective:  Colleen Rose is a 23 y.o. G3P1011 at 6214w2d being seen today for ongoing prenatal care.  She is currently monitored for the following issues for this high-risk pregnancy and has Supervision of high risk pregnancy, antepartum; Maternal varicella, non-immune; History of gestational diabetes; Gestational diabetes mellitus (GDM) affecting pregnancy; and IUGR (intrauterine growth restriction) affecting care of mother on their problem list.  Patient reports heartburn and occasional pelvic pressure.  Contractions: Not present. Vag. Bleeding: None.  Movement: Present. Denies leaking of fluid.   The following portions of the patient's history were reviewed and updated as appropriate: allergies, current medications, past family history, past medical history, past social history, past surgical history and problem list.   Objective:   Vitals:   08/23/18 1142  BP: 103/69  Pulse: 98  Temp: 97.9 F (36.6 C)  Weight: 164 lb 14.4 oz (74.8 kg)    Fetal Status: Fetal Heart Rate (bpm): 146   Movement: Present     General:  Alert, oriented and cooperative. Patient is in no acute distress.  Skin: Skin is warm and dry. No rash noted.   Cardiovascular: Normal heart rate noted  Respiratory: Normal respiratory effort, no problems with respiration noted  Abdomen: Soft, gravid, appropriate for gestational age.  Pain/Pressure: Absent     Pelvic: Cervical exam deferred        Extremities: Normal range of motion.  Edema: None  Mental Status: Normal mood and affect. Normal behavior. Normal judgment and thought content.   Assessment and Plan:  Pregnancy: G3P1011 at 5214w2d 1. Supervision of high risk pregnancy, antepartum - Tdap vaccine greater than or equal to 7yo IM  2. Maternal varicella, non-immune - Immunization PP recommended   3. Gestational diabetes mellitus (GDM) affecting pregnancy - Fasting BS 90-100 - PP BS 115-130 - Patient is taking 500 mg  Metformin with dinner instead of at bedtime due to N/V when taking on an empty stomach. Advised to trial with a bedtime snack to avoid N/V. If unable to tolerate will need to increase dose to 1000 mg Metformin with dinner. Patient advised to call the office if N/V occurs  4. Intrauterine growth restriction (IUGR) affecting care of mother, third trimester, single or unspecified fetus - Per US today, no longer IUGR, considered SGA with EFW 11% - MFM recommends continued weekly BPPs, Cord doppler UA in 2 weeks, Growth US in 4 weeks  5. Heartburn in pregnancy in third trimester - Discussed diet for heartburn including foods to avoid  - Advised TUMs PRN for heartburn first, if not sufficient can try Pepcid OTC BID  6. SGA (small for gestational age) - Per US today, see above   Preterm labor symptoms and general obstetric precautions including but not limited to vaginal bleeding, contractions, leaking of fluid and fetal movement were reviewed in detail with the patient. Please refer to After Visit Summary for other counseling recommendations.   [facility] about 2 weeks (around 09/06/2018[facility] - will need GBS and GC/Chlamydia   Future Appointments  Date Time Provider Department Center  08/30/2018 11:15 AM WH-MFC NURSE WH-MFC MFC-US  08/30/2018 11:15 AM WH-MFC US 4 WH-MFCUS MFC-US  08/30/2018 12:00 PM WH-MFC NST WH-MFC MFC-US  09/06/2018 11:15 AM WH-MFC NURSE WH-MFC MFC-US  09/06/2018 11:15 AM WH-MFC US 4 WH-MFCUS MFC-US  09/06/2018 12:00 PM WH-MFC NST WH-MFC MFC-US  09/13/2018 11:15 AM WH-MFC US 4 WH-MFCUS MFC-US  09/13/2018 11:20 AM WH-MFC NURSE WH-MFC MFC-US  09/20/2018 10:30  AM WH-MFC NURSE [place] MFC-US  09/20/2018 10:30 AM WH-MFC [place] 1 WH-MFCUS MFC-US    Kerry Hough, PA-C

## 2018-08-30 ENCOUNTER — Ambulatory Visit (HOSPITAL_COMMUNITY): Payer: Self-pay

## 2018-08-30 ENCOUNTER — Ambulatory Visit (HOSPITAL_COMMUNITY): Payer: Self-pay | Admitting: *Deleted

## 2018-08-30 ENCOUNTER — Encounter (HOSPITAL_COMMUNITY): Payer: Self-pay | Admitting: *Deleted

## 2018-08-30 ENCOUNTER — Other Ambulatory Visit: Payer: Self-pay

## 2018-08-30 ENCOUNTER — Encounter (HOSPITAL_COMMUNITY): Payer: Self-pay

## 2018-08-30 ENCOUNTER — Ambulatory Visit (HOSPITAL_COMMUNITY)
Admission: RE | Admit: 2018-08-30 | Discharge: 2018-08-30 | Disposition: A | Discharge status: Home / Self Care | Payer: Self-pay | Source: Ambulatory Visit | Attending: Maternal & Fetal Medicine | Admitting: Maternal & Fetal Medicine

## 2018-08-30 DIAGNOSIS — O09293 Supervision of pregnancy with other poor reproductive or obstetric history, third trimester: Secondary | ICD-10-CM

## 2018-08-30 DIAGNOSIS — O365930 Maternal care for other known or suspected poor fetal growth, third trimester, not applicable or unspecified: Secondary | ICD-10-CM

## 2018-08-30 DIAGNOSIS — O24415 Gestational diabetes mellitus in pregnancy, controlled by oral hypoglycemic drugs: Secondary | ICD-10-CM | POA: Insufficient documentation

## 2018-08-30 DIAGNOSIS — O24419 Gestational diabetes mellitus in pregnancy, unspecified control: Secondary | ICD-10-CM | POA: Insufficient documentation

## 2018-08-30 DIAGNOSIS — Z3A35 35 weeks gestation of pregnancy: Secondary | ICD-10-CM

## 2018-08-30 DIAGNOSIS — O26843 Uterine size-date discrepancy, third trimester: Secondary | ICD-10-CM

## 2018-09-01 ENCOUNTER — Encounter: Payer: Self-pay | Admitting: General Practice

## 2018-09-06 ENCOUNTER — Other Ambulatory Visit (HOSPITAL_COMMUNITY): Payer: Self-pay | Admitting: Obstetrics and Gynecology

## 2018-09-06 ENCOUNTER — Other Ambulatory Visit (HOSPITAL_COMMUNITY): Payer: Self-pay | Admitting: Maternal & Fetal Medicine

## 2018-09-06 ENCOUNTER — Ambulatory Visit (HOSPITAL_COMMUNITY): Payer: Self-pay

## 2018-09-06 ENCOUNTER — Ambulatory Visit (HOSPITAL_COMMUNITY): Payer: Self-pay | Admitting: *Deleted

## 2018-09-06 ENCOUNTER — Other Ambulatory Visit: Payer: Self-pay

## 2018-09-06 ENCOUNTER — Ambulatory Visit (HOSPITAL_COMMUNITY)
Admission: RE | Admit: 2018-09-06 | Discharge: 2018-09-06 | Disposition: A | Discharge status: Home / Self Care | Payer: Self-pay | Source: Ambulatory Visit | Attending: Obstetrics and Gynecology | Admitting: Obstetrics and Gynecology

## 2018-09-06 ENCOUNTER — Encounter (HOSPITAL_COMMUNITY): Payer: Self-pay | Admitting: *Deleted

## 2018-09-06 DIAGNOSIS — O26843 Uterine size-date discrepancy, third trimester: Secondary | ICD-10-CM

## 2018-09-06 DIAGNOSIS — O365930 Maternal care for other known or suspected poor fetal growth, third trimester, not applicable or unspecified: Secondary | ICD-10-CM | POA: Insufficient documentation

## 2018-09-06 DIAGNOSIS — O24419 Gestational diabetes mellitus in pregnancy, unspecified control: Secondary | ICD-10-CM

## 2018-09-06 DIAGNOSIS — O24415 Gestational diabetes mellitus in pregnancy, controlled by oral hypoglycemic drugs: Secondary | ICD-10-CM | POA: Insufficient documentation

## 2018-09-06 DIAGNOSIS — Z3A36 36 weeks gestation of pregnancy: Secondary | ICD-10-CM

## 2018-09-06 DIAGNOSIS — O09293 Supervision of pregnancy with other poor reproductive or obstetric history, third trimester: Secondary | ICD-10-CM

## 2018-09-07 ENCOUNTER — Encounter: Payer: Self-pay | Admitting: Obstetrics & Gynecology

## 2018-09-10 ENCOUNTER — Encounter: Payer: Self-pay | Admitting: Family Medicine

## 2018-09-13 ENCOUNTER — Ambulatory Visit (HOSPITAL_COMMUNITY): Admission: RE | Admit: 2018-09-13 | Payer: Self-pay | Source: Ambulatory Visit

## 2018-09-13 ENCOUNTER — Encounter (HOSPITAL_COMMUNITY): Payer: Self-pay

## 2018-09-13 ENCOUNTER — Ambulatory Visit (HOSPITAL_COMMUNITY): Payer: Self-pay | Attending: Maternal & Fetal Medicine

## 2018-09-13 ENCOUNTER — Encounter: Payer: Self-pay | Admitting: Advanced Practice Midwife

## 2018-09-20 ENCOUNTER — Ambulatory Visit (HOSPITAL_COMMUNITY): Admission: RE | Admit: 2018-09-20 | Payer: Self-pay | Source: Ambulatory Visit

## 2018-09-20 ENCOUNTER — Ambulatory Visit (HOSPITAL_COMMUNITY): Payer: Self-pay

## 2018-09-28 ENCOUNTER — Encounter: Payer: Self-pay | Admitting: Family Medicine

## 2018-09-28 ENCOUNTER — Telehealth: Payer: Self-pay | Admitting: Family Medicine

## 2018-09-28 NOTE — Telephone Encounter (Signed)
Left a detailed voicemail message for patient to come in the office this afternoon for an appointment.

## 2018-09-29 ENCOUNTER — Encounter: Payer: Self-pay | Admitting: Family Medicine

## 2018-09-29 NOTE — Progress Notes (Signed)
Patient did not keep appointment today. She will be called to reschedule.  

## 2018-12-25 ENCOUNTER — Encounter (HOSPITAL_COMMUNITY): Payer: Self-pay

## 2019-09-18 ENCOUNTER — Other Ambulatory Visit: Payer: Self-pay | Admitting: Student

## 2020-11-25 ENCOUNTER — Other Ambulatory Visit (HOSPITAL_COMMUNITY): Payer: Self-pay

## 2020-11-25 ENCOUNTER — Other Ambulatory Visit (HOSPITAL_COMMUNITY)
Admission: EM | Admit: 2020-11-25 | Discharge: 2020-11-26 | Disposition: A | Discharge status: Home / Self Care | Payer: No Payment, Other | Attending: Nurse Practitioner | Admitting: Nurse Practitioner

## 2020-11-25 DIAGNOSIS — F4321 Adjustment disorder with depressed mood: Secondary | ICD-10-CM | POA: Diagnosis not present

## 2020-11-25 DIAGNOSIS — F322 Major depressive disorder, single episode, severe without psychotic features: Secondary | ICD-10-CM

## 2020-11-25 DIAGNOSIS — Z9114 Patient's other noncompliance with medication regimen: Secondary | ICD-10-CM | POA: Insufficient documentation

## 2020-11-25 DIAGNOSIS — Z20822 Contact with and (suspected) exposure to covid-19: Secondary | ICD-10-CM | POA: Diagnosis not present

## 2020-11-25 DIAGNOSIS — R45851 Suicidal ideations: Secondary | ICD-10-CM | POA: Diagnosis not present

## 2020-11-25 DIAGNOSIS — F1290 Cannabis use, unspecified, uncomplicated: Secondary | ICD-10-CM | POA: Diagnosis not present

## 2020-11-25 LAB — POCT URINE DRUG SCREEN - MANUAL ENTRY (I-SCREEN)
POC Amphetamine UR: NOT DETECTED
POC Buprenorphine (BUP): NOT DETECTED
POC Cocaine UR: NOT DETECTED
POC Marijuana UR: NOT DETECTED
POC Methadone UR: NOT DETECTED
POC Methamphetamine UR: NOT DETECTED
POC Morphine: NOT DETECTED
POC Oxazepam (BZO): NOT DETECTED
POC Oxycodone UR: NOT DETECTED
POC Secobarbital (BAR): NOT DETECTED

## 2020-11-25 MED ORDER — HYDROXYZINE HCL 25 MG PO TABS: 25.0000 mg | ORAL_TABLET | Freq: Three times a day (TID) | ORAL | Status: DC | PRN

## 2020-11-25 MED ORDER — ESCITALOPRAM OXALATE 10 MG PO TABS
ORAL_TABLET | ORAL | Status: AC
  Administered 2020-11-25: 10 mg via ORAL
  Filled 2020-11-25: qty 1

## 2020-11-25 MED ORDER — ESCITALOPRAM OXALATE 10 MG PO TABS: 10.0000 mg | ORAL_TABLET | Freq: Once | ORAL | Status: AC

## 2020-11-25 MED ORDER — MAGNESIUM HYDROXIDE 400 MG/5ML PO SUSP: 30.0000 mL | Freq: Every day | ORAL | Status: DC | PRN

## 2020-11-25 MED ORDER — ALUM & MAG HYDROXIDE-SIMETH 200-200-20 MG/5ML PO SUSP: 30.0000 mL | ORAL | Status: DC | PRN

## 2020-11-25 MED ORDER — HYDROXYZINE HCL 25 MG PO TABS
ORAL_TABLET | ORAL | Status: AC
  Administered 2020-11-25: 25 mg via ORAL
  Filled 2020-11-25: qty 1

## 2020-11-25 MED ORDER — ACETAMINOPHEN 325 MG PO TABS: 650.0000 mg | ORAL_TABLET | Freq: Four times a day (QID) | ORAL | Status: DC | PRN

## 2020-11-25 NOTE — ED Notes (Addendum)
Blood draw attempts unsuccessful by RN and LPN. Pt experienced redness, itching, and hives at sites where tourniquet was placed (left upper arm, right upper arm, right lower arm). NP notified, in to assess pt, and advised to give pt food, drink, and PRN Hydroxyzine. Apple juice and [place] sandwich given per pt request. Pt educated on symptoms of worsening allergic reaction to report to nursing staff, pt verbalized understanding. No signs of acute distress noted. Will continue to monitor for safety.

## 2020-11-25 NOTE — ED Provider Notes (Signed)
Behavioral Health Admission H&P [facility] & [facility])  Date: 11/26/20 Patient Name: Colleen Rose MRN: [id number] Chief Complaint:  Chief Complaint  Patient presents with   Suicidal      Diagnoses:  Final diagnoses:  MDD (major depressive disorder), single episode, severe (HCC)    HPI: Patient presents to [facility] via police transport with active suicidal ideations. Patient presents here voluntarily and is not under IVC. Patient reports a long history of untreated depression symptoms. She reports over the last 4 months recent stressors including breaking up with her boyfriend ( who is the father of both of her small children) and strained relationship with her father since having to return back home to live since break up has escalated depression, sadness, and hopelessness. Since the break-up she is raising and financially supporting her two children alone. She endorses a highly dysfunctional relationship with her father. Over the last 4 months she has experienced fleeting thoughts of suicide. Over the last two weeks the thoughts of SI have become more concrete to include a plan of either stabbing herself or jumping from bridge. She denies HI. She is is not experiencing delusions or hallucinations. She has no known chronic medication conditions.  No prior behavioral health admission or treatment for depression. Denies experiencing any depression during or after either two pregnancies. She endorses occasional social weekend alcohol use although denies illicit drug use. Patient verbalizes need for help and voluntarily agrees to admission.   PHQ 2-9:  Flowsheet Row Video Visit from 08/03/2018 in [facility] Initial Prenatal from 03/08/2018 in [facility]  Thoughts that you would be better off dead, or of hurting yourself in some way Not at all Not at all  PHQ-9 Total Score 0 5       Flowsheet Row ED from 11/25/2020 in [facility]  C-SSRS RISK CATEGORY High Risk        Total Time spent with patient: 1 hour  Musculoskeletal  Strength & Muscle Tone: within normal limits Gait & Station: normal Patient leans: N/A  Psychiatric Specialty Exam  Presentation General Appearance: Appropriate for Environment; Fairly Groomed  Eye Contact:Good  Speech:Normal Rate  Speech Volume:Normal  Handedness:No data recorded  Mood and Affect  Mood:Depressed; Hopeless  Affect:Depressed; Tearful   Thought Process  Thought Processes:Coherent  Descriptions of Associations:Intact  Orientation:Full (Time, Place and Person)  Thought Content:WDL    Hallucinations:Hallucinations: None  Ideas of Reference:None  Suicidal Thoughts:Suicidal Thoughts: Yes, Active SI Active Intent and/or Plan: With Plan; Without Means to Carry Out  Homicidal Thoughts:Homicidal Thoughts: No   Sensorium  Memory:Immediate Good; Recent Good; Remote Good  Judgment:Impaired (Suicidal ideations)  Insight:Present   Executive Functions  Concentration:Good  Attention Span:Good  Recall:Good  Fund of Knowledge:Good  Language:Good   Psychomotor Activity  Psychomotor Activity:Psychomotor Activity: Normal   Assets  Assets:Communication Skills; Financial Resources/Insurance; Housing; Physical Health; Desire for Improvement   Sleep  Sleep:No data recorded  No data recorded  Physical Exam Constitutional:      General: She is not in acute distress.    Appearance: Normal appearance.  HENT:     Head: Normocephalic and atraumatic.     Right Ear: External ear normal.     Left Ear: External ear normal.     Nose: Nose normal.     Mouth/Throat:     Mouth: Mucous membranes are moist.  Eyes:     Extraocular Movements: Extraocular movements intact.     Pupils:  Pupils are equal, round, and reactive to light.  Cardiovascular:     Rate and Rhythm: Normal rate and regular rhythm.  Pulmonary:     Effort:  Pulmonary effort is normal.     Breath sounds: Normal breath sounds.  Musculoskeletal:        General: Normal range of motion.     Cervical back: Normal range of motion and neck supple.  Skin:    General: Skin is warm.     Capillary Refill: Capillary refill takes less than 2 seconds.  Neurological:     General: No focal deficit present.     Mental Status: She is alert and oriented to person, place, and time.  Psychiatric:        Attention and Perception: Attention normal.        Mood and Affect: Mood is depressed.        Speech: Speech normal.        Behavior: Behavior normal.        Thought Content: Thought content is not paranoid or delusional. Thought content includes suicidal ideation. Thought content does not include homicidal ideation. Thought content includes suicidal plan. Thought content does not include homicidal plan.        Cognition and Memory: Cognition and memory normal.        Judgment: Judgment is not impulsive or inappropriate.   Review of Systems  Constitutional: Negative.   HENT: Negative.    Eyes: Negative.   Respiratory: Negative.    Cardiovascular: Negative.   Gastrointestinal: Negative.   Genitourinary: Negative.   Musculoskeletal: Negative.   Skin:  Positive for itching.       Urticaria bilateral arms   Neurological: Negative.   Endo/Heme/Allergies: Negative.   Psychiatric/Behavioral:  Positive for depression and suicidal ideas. Negative for hallucinations and memory loss. The patient is not nervous/anxious and does not have insomnia.    Blood pressure 99/68, pulse 93, temperature 98.2 F (36.8 C), temperature source Oral, resp. rate 18, SpO2 100 %, unknown if currently breastfeeding. There is no height or weight on file to calculate BMI.  Past Psychiatric History: No prior mental health diagnosis or behavioral health admissions.  Is the patient at risk to self? Yes  Has the patient been a risk to self in the past 6 months? No .    Has the patient  been a risk to self within the distant past? Yes   Is the patient a risk to others? No   Has the patient been a risk to others in the past 6 months? No   Has the patient been a risk to others within the distant past? No   Past Medical History:  Past Medical History:  Diagnosis Date   Cholestasis of pregnancy 2017   Gestational diabetes    diet controlled   Infection    UTI    Past Surgical History:  Procedure Laterality Date   NO PAST SURGERIES      Family History:  Family History  Problem Relation Age of Onset   Hypertension Mother    Diabetes Mother    Hypertension Father    Diabetes Father     Social History:  Social History   Socioeconomic History   Marital status: Single    Spouse name: Not on file   Number of children: Not on file   Years of education: Not on file   Highest education level: Not on file  Occupational History   Not on file  Tobacco Use  Smoking status: Never   Smokeless tobacco: Never  Vaping Use   Vaping Use: Never used  Substance and Sexual Activity   Alcohol use: No   Drug use: Not Currently    Types: Marijuana    Comment: 2015   Sexual activity: Yes    Birth control/protection: None  Other Topics Concern   Not on file  Social History Narrative   Not on file   Social Determinants of Health   Financial Resource Strain: Not on file  Food Insecurity: Not on file  Transportation Needs: Not on file  Physical Activity: Not on file  Stress: Not on file  Social Connections: Not on file  Intimate Partner Violence: Not on file    SDOH:  SDOH Screenings   Alcohol Screen: Not on file  Depression (PHQ2-9): Not on file  Financial Resource Strain: Not on file  Food Insecurity: Not on file  Housing: Not on file  Physical Activity: Not on file  Social Connections: Not on file  Stress: Not on file  Tobacco Use: Not on file  Transportation Needs: Not on file    Last Labs:  Admission on 11/25/2020  Component Date Value Ref Range  Status   SARS Coronavirus 2 by RT PCR 11/25/2020 NEGATIVE  NEGATIVE Final   Comment: (NOTE) SARS-CoV-2 target nucleic acids are NOT DETECTED.  The SARS-CoV-2 RNA is generally detectable in upper respiratory specimens during the acute phase of infection. The lowest concentration of SARS-CoV-2 viral copies this assay can detect is 138 copies/mL. A negative result does not preclude SARS-Cov-2 infection and should not be used as the sole basis for treatment or other patient management decisions. A negative result may occur with  improper specimen collection/handling, submission of specimen other than nasopharyngeal swab, presence of viral mutation(s) within the areas targeted by this assay, and inadequate number of viral copies(<138 copies/mL). A negative result must be combined with clinical observations, patient history, and epidemiological information. The expected result is Negative.  Fact Sheet for Patients:  BloggerCourse.com  Fact Sheet for Healthcare Providers:  SeriousBroker.it  This test is no                          t yet approved or cleared by the [place] FDA and  has been authorized for detection and/or diagnosis of SARS-CoV-2 by FDA under an Emergency Use Authorization (EUA). This EUA will remain  in effect (meaning this test can be used) for the duration of the COVID-19 declaration under Section 564(b)(1) of the Act, 21 U.S.C.section 360bbb-3(b)(1), unless the authorization is terminated  or revoked sooner.       Influenza A by PCR 11/25/2020 NEGATIVE  NEGATIVE Final   Influenza B by PCR 11/25/2020 NEGATIVE  NEGATIVE Final   Comment: (NOTE) The Xpert Xpress SARS-CoV-2/FLU/RSV plus assay is intended as an aid in the diagnosis of influenza from Nasopharyngeal swab specimens and should not be used as a sole basis for treatment. Nasal washings and aspirates are unacceptable for Xpert Xpress  SARS-CoV-2/FLU/RSV testing.  Fact Sheet for Patients: BloggerCourse.com  Fact Sheet for Healthcare Providers: SeriousBroker.it  This test is not yet approved or cleared by the [place] FDA and has been authorized for detection and/or diagnosis of SARS-CoV-2 by FDA under an Emergency Use Authorization (EUA). This EUA will remain in effect (meaning this test can be used) for the duration of the COVID-19 declaration under Section 564(b)(1) of the Act, 21 U.S.C. section 360bbb-3(b)(1), unless the  authorization is terminated or revoked.  Performed at [facility], [address], [place], [place] [address]    Preg Test, Ur 11/25/2020 NEGATIVE  NEGATIVE Final   Performed at [facility], [address] [place], [place] [address]   POC Amphetamine UR 11/25/2020 None Detected  NONE DETECTED (Cut Off Level 1000 ng/mL) Preliminary   POC Secobarbital (BAR) 11/25/2020 None Detected  NONE DETECTED (Cut Off Level 300 ng/mL) Preliminary   POC Buprenorphine (BUP) 11/25/2020 None Detected  NONE DETECTED (Cut Off Level 10 ng/mL) Preliminary   POC Oxazepam (BZO) 11/25/2020 None Detected  NONE DETECTED (Cut Off Level 300 ng/mL) Preliminary   POC Cocaine UR 11/25/2020 None Detected  NONE DETECTED (Cut Off Level 300 ng/mL) Preliminary   POC Methamphetamine UR 11/25/2020 None Detected  NONE DETECTED (Cut Off Level 1000 ng/mL) Preliminary   POC Morphine 11/25/2020 None Detected  NONE DETECTED (Cut Off Level 300 ng/mL) Preliminary   POC Oxycodone UR 11/25/2020 None Detected  NONE DETECTED (Cut Off Level 100 ng/mL) Preliminary   POC Methadone UR 11/25/2020 None Detected  NONE DETECTED (Cut Off Level 300 ng/mL) Preliminary   POC Marijuana UR 11/25/2020 None Detected  NONE DETECTED (Cut Off Level 50 ng/mL) Preliminary   Color, Urine 11/25/2020 YELLOW  YELLOW Final   APPearance 11/25/2020 HAZY (A)  CLEAR Final   Specific Gravity, Urine  11/25/2020 1.017  1.005 - 1.030 Final   pH 11/25/2020 6.0  5.0 - 8.0 Final   Glucose, UA 11/25/2020 NEGATIVE  NEGATIVE mg/dL Final   Hgb urine dipstick 11/25/2020 NEGATIVE  NEGATIVE Final   Bilirubin Urine 11/25/2020 NEGATIVE  NEGATIVE Final   Ketones, ur 11/25/2020 NEGATIVE  NEGATIVE mg/dL Final   Protein, ur 37/05/8887 NEGATIVE  NEGATIVE mg/dL Final   Nitrite 16/94/5038 NEGATIVE  NEGATIVE Final   Leukocytes,Ua 11/25/2020 LARGE (A)  NEGATIVE Final   RBC / HPF 11/25/2020 0-5  0 - 5 RBC/hpf Final   WBC, UA 11/25/2020 0-5  0 - 5 WBC/hpf Final   Bacteria, UA 11/25/2020 RARE (A)  NONE SEEN Final   Squamous Epithelial / LPF 11/25/2020 6-10  0 - 5 Final   Mucus 11/25/2020 PRESENT   Final   Performed at [facility], [address] [place], [place] [address]   SARSCOV2ONAVIRUS 2 AG 11/25/2020 NEGATIVE  NEGATIVE Final   Comment: (NOTE) SARS-CoV-2 antigen NOT DETECTED.   Negative results are presumptive.  Negative results do not preclude SARS-CoV-2 infection and should not be used as the sole basis for treatment or other patient management decisions, including infection  control decisions, particularly in the presence of clinical signs and  symptoms consistent with COVID-19, or in those who have been in contact with the virus.  Negative results must be combined with clinical observations, patient history, and epidemiological information. The expected result is Negative.  Fact Sheet for Patients: https://www.jennings-kim.com/  Fact Sheet for Healthcare Providers: https://alexander-rogers.biz/  This test is not yet approved or cleared by the [place] FDA and  has been authorized for detection and/or diagnosis of SARS-CoV-2 by FDA under an Emergency Use Authorization (EUA).  This EUA will remain in effect (meaning this test can be used) for the duration of  the COV                          ID-19 declaration under Section 564(b)(1) of the Act, 21 U.S.C.  section 360bbb-3(b)(1), unless the authorization is terminated or revoked  sooner.     Preg Test, Ur 11/25/2020 NEGATIVE  NEGATIVE Final   Comment:        THE SENSITIVITY OF THIS METHODOLOGY IS >24 mIU/mL     Allergies: Other, peaches   PTA Medications: No prior home medictions  Medical Decision Making  Admit to facility based crisis unit for safety and medication management for depressive symptoms with active suicidal ideations with plan.   Start antidepressant therapy Lexapro 10 mg once daily.   Staff unsuccessful at venipuncture  Lab Orders         Resp Panel by RT-PCR (Flu A&B, Covid) Nasopharyngeal Swab         CBC with Differential/Platelet         Comprehensive metabolic panel         Hemoglobin A1c         Ethanol         Lipid panel         TSH         Pregnancy, urine         Urinalysis, Routine w reflex microscopic         POCT Urine Drug Screen - (ICup)         POC SARS Coronavirus 2 Ag-ED - Nasal Swab         POC SARS Coronavirus 2 Ag         Pregnancy, urine POC       Clinical Course as of 11/26/20 0759  Tue Nov 26, 2020  0319 POCT Urine Drug Screen - (ICup) UDS negative [JB]  0319 Preg Test, Ur: NEGATIVE [JB]  0320 SARS Coronavirus 2 by RT PCR: NEGATIVE [JB]    Clinical Course User Index [JB] Jackelyn Poling, NP    Patient also has hive like rash on bilateral arms, Hydroxyzine 25 mg as needed for anxiety and or itching. Patient stable, cooperative, and voluntarily agee to be admitted.  Recommendations  Based on my evaluation the patient does not appear to have an emergency medical condition.  Jackelyn Poling, NP 11/26/20  7:59 AM

## 2020-11-25 NOTE — Progress Notes (Signed)
   11/25/20 2044  Patient Reported Information  How Did You Hear About [place]? Legal System  What Is the Reason for Your Visit/Call Today? Pt reports, symptoms of depression, suicidal ideations with impulsive plans. Pt reports, not getting along with her dad and breaking up with her children's father in June are stressors. Pt is unable to contract for safety with a plan to jump off a bridge or stab herself.  How Long Has This Been Causing You Problems? 1-6 months  What Do You Feel Would Help You the Most Today? Treatment for Depression or other mood problem  Have You Recently Had Any Thoughts About Hurting Yourself? Yes  Are You Planning to Commit Suicide/Harm Yourself At This time? Yes  Have you Recently Had Thoughts About Hurting Someone Karolee Ohs? No  Are You Planning To Harm Someone At This Time? No  Have You Used Any Alcohol or Drugs in the Past 24 Hours? Yes  What Did You Use and How Much? Pt reports drinking liquor on the weekends.  Do You Currently Have a Therapist/Psychiatrist? No  [facility] Screening Triage Referral Assessment  Type of Contact Face-to-Face  Location of Assessment [facility] Assessment Services  Provider location [facility] Assessment Services  Collateral Involvement Pt denies, having supports.  Patient Determined To Be At Risk for Harm To Self or Others Based on Review of Patient Reported Information or Presenting Complaint? Yes, for Self-Harm  Does Patient Present under Involuntary Commitment? No  [place] of Residence [place]  Patient Currently Receiving the Following Services: Not Receiving Services  Determination of Need Emergent (2 hours)  Options For Referral Inpatient Hospitalization;BH Urgent Care    Determination of need: Emergent.   Redmond Pulling, MS, [facility], [facility] Triage Specialist [phone]
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
## 2020-11-26 ENCOUNTER — Other Ambulatory Visit: Payer: Self-pay

## 2020-11-26 DIAGNOSIS — F4321 Adjustment disorder with depressed mood: Secondary | ICD-10-CM | POA: Diagnosis not present

## 2020-11-26 DIAGNOSIS — R45851 Suicidal ideations: Secondary | ICD-10-CM | POA: Diagnosis not present

## 2020-11-26 DIAGNOSIS — F1290 Cannabis use, unspecified, uncomplicated: Secondary | ICD-10-CM | POA: Diagnosis not present

## 2020-11-26 DIAGNOSIS — Z20822 Contact with and (suspected) exposure to covid-19: Secondary | ICD-10-CM | POA: Diagnosis not present

## 2020-11-26 LAB — POC SARS CORONAVIRUS 2 AG: SARSCOV2ONAVIRUS 2 AG: NEGATIVE

## 2020-11-26 LAB — URINALYSIS, ROUTINE W REFLEX MICROSCOPIC
Bilirubin Urine: NEGATIVE
Glucose, UA: NEGATIVE mg/dL
Hgb urine dipstick: NEGATIVE
Ketones, ur: NEGATIVE mg/dL
Nitrite: NEGATIVE
Protein, ur: NEGATIVE mg/dL
Specific Gravity, Urine: 1.017 (ref 1.005–1.030)
pH: 6 (ref 5.0–8.0)

## 2020-11-26 LAB — RESP PANEL BY RT-PCR (FLU A&B, COVID) ARPGX2
Influenza A by PCR: NEGATIVE
Influenza B by PCR: NEGATIVE
SARS Coronavirus 2 by RT PCR: NEGATIVE

## 2020-11-26 LAB — PREGNANCY, URINE: Preg Test, Ur: NEGATIVE

## 2020-11-26 LAB — POCT PREGNANCY, URINE: Preg Test, Ur: NEGATIVE

## 2020-11-26 MED ORDER — ESCITALOPRAM OXALATE 10 MG PO TABS
10.0000 mg | ORAL_TABLET | Freq: Every day | ORAL | Status: AC
  Administered 2020-11-26: 10 mg via ORAL
  Filled 2020-11-26: qty 1

## 2020-11-26 MED ORDER — HYDROXYZINE HCL 25 MG PO TABS: 25.0000 mg | ORAL_TABLET | Freq: Three times a day (TID) | ORAL | 0 refills | Status: AC | PRN

## 2020-11-26 MED ORDER — ESCITALOPRAM OXALATE 10 MG PO TABS: 10.0000 mg | ORAL_TABLET | Freq: Every day | ORAL | 0 refills | Status: AC

## 2020-11-26 NOTE — ED Notes (Signed)
Pt sleeping@this  time. Breathing even and unlabored. Will continue to monitor safety

## 2020-11-26 NOTE — ED Notes (Signed)
Redness, hives, and itching no longer present to pt's arms. Pt denies any other symptoms. Will continue to monitor for safety.

## 2020-11-26 NOTE — ED Notes (Signed)
Patient A&Ox4. Denies intent to harm self/others when asked. Denies A/VH. Patient denies any physical complaints when asked. No acute distress noted. Support provided. Routine safety checks conducted according to facility protocol. Encouraged patient to notify staff if thoughts of harm toward self or others arise. Patient verbalize understanding and agreement. Will continue to monitor for safety.

## 2020-11-26 NOTE — Progress Notes (Signed)
Patient admitted to [facility].  Cooperative with admission process and given tour of the unit.  All questions answered and patient verbalized understanding.  Denied SI, HI, AVH at this time.  Rated anxiety 8/10 and depression 10/10.  Ambulated independently without issue.  Will continue to monitor.

## 2020-11-26 NOTE — ED Notes (Signed)
Pt sleeping in no acute distress. RR even and unlabored. Safety maintained. 

## 2020-11-26 NOTE — Progress Notes (Signed)
AVS and Rxs reviewed with patient and all questions answered.  Patient verbalized understanding of information presented.  All belongings returned and belongings sheet signed.  Patient ambulated independently to lobby without issue.  Patient discharged home in stable condition; no acute distress noted.

## 2020-11-26 NOTE — Discharge Instructions (Signed)

## 2020-11-26 NOTE — ED Provider Notes (Signed)
[facility] Discharge Suicide Risk Assessment   Principal Problem: adjustment disorder with depressed mood Discharge Diagnoses: Active Problems:   Adjustment disorder with depressed mood   Total Time spent with patient: 45 minutes  Musculoskeletal: Strength & Muscle Tone: within normal limits Gait & Station: normal Patient leans: N/A  Psychiatric Specialty Exam  Presentation  General Appearance: Appropriate for Environment; Casual  Eye Contact:Good  Speech:Clear and Coherent; Normal Rate  Speech Volume:Normal  Handedness:No data recorded  Mood and Affect  Mood:Euthymic  Duration of Depression Symptoms: Greater than two weeks  Affect:Appropriate; Congruent   Thought Process  Thought Processes:Coherent; Goal Directed; Linear  Descriptions of Associations:Intact  Orientation:Full (Time, Place and Person)  Thought Content:WDL; Logical  History of Schizophrenia/Schizoaffective disorder:No data recorded Duration of Psychotic Symptoms:No data recorded Hallucinations:Hallucinations: None  Ideas of Reference:None  Suicidal Thoughts:Suicidal Thoughts: No  Homicidal Thoughts:Homicidal Thoughts: No   Sensorium  Memory:Immediate Good; Recent Good; Remote Good  Judgment:Fair  Insight:Fair   Executive Functions  Concentration:Good  Attention Span:Good  Recall:Good  Fund of Knowledge:Good  Language:Good   Psychomotor Activity  Psychomotor Activity:Psychomotor Activity: Normal   Assets  Assets:Communication Skills; Desire for Improvement; Housing; Physical Health; Resilience   Sleep  Sleep:Sleep: Fair  Physical Exam: Physical Exam Vitals and nursing note reviewed.  Constitutional:      Appearance: Normal appearance. She is normal weight.  HENT:     Head: Normocephalic and atraumatic.  Eyes:     Extraocular Movements: Extraocular movements intact.     Conjunctiva/sclera: Conjunctivae normal.  Cardiovascular:     Rate and Rhythm: Normal rate and  regular rhythm.     Heart sounds: Normal heart sounds.  Pulmonary:     Effort: Pulmonary effort is normal.     Breath sounds: Normal breath sounds.  Abdominal:     General: Bowel sounds are normal.     Palpations: Abdomen is soft.  Neurological:     General: No focal deficit present.     Mental Status: She is alert and oriented to person, place, and time.   Review of Systems  Constitutional:  Negative for chills and fever.  HENT:  Negative for hearing loss.   Eyes:  Negative for discharge and redness.  Respiratory:  Negative for cough.   Cardiovascular:  Negative for chest pain.  Gastrointestinal:  Negative for abdominal pain.  Musculoskeletal:  Negative for myalgias.  Neurological:  Negative for headaches.  Psychiatric/Behavioral:  Positive for depression. Negative for hallucinations, substance abuse and suicidal ideas.   Blood pressure 99/68, pulse 93, temperature 98.2 F (36.8 C), temperature source Oral, resp. rate 18, SpO2 100 %, unknown if currently breastfeeding. There is no height or weight on file to calculate BMI.  Mental Status Per Nursing Assessment::   On Admission:   reported SI; no intent  Demographic Factors:  Adolescent or young adult  Loss Factors: Loss of significant relationship-break up with father of her children a few months ago  Historical Factors: Prior suicide attempts   Risk Reduction Factors:   Responsible for children under 11 years of age, Sense of responsibility to family, Employed, and Living with another person, especially a relative  Continued Clinical Symptoms:  Adjustment disorder with depressed mood  Cognitive Features That Contribute To Risk:  Thought constriction (tunnel vision)    Suicide Risk:  Mild:  Suicidal ideation of limited frequency, intensity, duration, and specificity.  There are no identifiable plans, no associated intent, mild dysphoria and related symptoms, good self-control (both objective and subjective assessment),  few other risk factors, and identifiable protective factors, including available and accessible social support.   Follow-up Information     [place] [facility]. Go to.   Specialty: Behavioral Health Why: Pleae go during walk-in hours to establish outpatient psychiatric services, such as medication management and outpatient therapy.   Medication Management Walk-In Hours: Monday-Friday from 8:00am-11:00am. Please arrive by 7:30am-7:45am, as patients are seen on a first come, first served basis.  Therapy Walk-In Hours: Monday-Wednesday from 8:00am-until slots are filled. Please arrive by 7:30am-7:45am, as patients are seen on a first come, first served basis.   Friday hours are 1:00pm-5:00pm Contact information: [address] [place] [address] [phone]                Plan Of Care/Follow-up recommendations:  Activity:  as tolerated Diet:  regular Other:     Patient is instructed prior to discharge to: Take all medications as prescribed by his/her mental healthcare provider. Report any adverse effects and or reactions from the medicines to his/her outpatient provider promptly. Patient has been instructed & cautioned: To not engage in alcohol and or illegal drug use while on prescription medicines. In the event of worsening symptoms, patient is instructed to call the crisis hotline, 911 and or go to the nearest ED for appropriate evaluation and treatment of symptoms. To follow-up with his/her primary care provider for your other medical issues, concerns and or health care needs.   Allergies as of 11/26/2020       Reactions   Other Anaphylaxis   Peaches        Medication List     TAKE these medications    aspirin-acetaminophen-caffeine 250-250-65 MG tablet Commonly known as: EXCEDRIN MIGRAINE Take 2 tablets by mouth every 6 (six) hours as needed for migraine.   escitalopram 10 MG tablet Commonly known as: LEXAPRO Take 1 tablet (10  mg total) by mouth daily.   hydrOXYzine 25 MG tablet Commonly known as: ATARAX/VISTARIL Take 1 tablet (25 mg total) by mouth 3 (three) times daily as needed for anxiety.   OVER THE COUNTER MEDICATION Take 20 mg by mouth at bedtime. Melatonin Gummies 10mg       Patient provided with prescription for 30 days for hydroxyzine and Lexapro.  Discussed walk-in hours at the [facility].  Patient expressed understanding and stated that she would present for walk-in appointment.    [facility], MD 11/27/2020, 8:28 AM

## 2020-11-26 NOTE — ED Provider Notes (Signed)
FBC/OBS ASAP Discharge Summary  Date and Time: 11/27/2020 8:29 AM  Name: Colleen Rose  MRN:  [id number]   Discharge Diagnoses:  Final diagnoses:  Adjustment disorder with depressed mood    Subjective:  Patient seen and chart reviewed.  She has been medication compliant with Lexapro 10 mg was started by overnight provider.  Prior to my interview, I was informed the patient was requesting to leave.  Patient interviewed in her room this afternoon.  She describes her mood as "tired".  She denies SI/HI/AVH.  When asked what brought her into the hospital, patient states "got into an argument with my dad".  On further discussion, patient describes her and her father being "angry" and that they often get into disagreements.  Patient states that she has 2 children aged 5 and 2 which live with her.  Patient states that she broke up with her boyfriend who is the father of her children in June/July of this year, and since this time she had to move back in with her parents.  Patient states that in the home is currently her mom, her father, 66 year old sister, her and her 2 children.  Patient describes getting into multiple arguments with her father regarding her wanting to hang out with friends.  Patient describes that she feels that she is being treated like she is "still in high school".  Patient states that she will often go hang out with her friends and pay her 69 year old sister to babysit.  Patient states that she works at a Conservation officer, nature at a [employer]  Patient states that she likes her job and is currently trying to save up money to move out of her parents house.  Patient describes her support system as being her 2 friends.  In discussing her recent suicidal thoughts, patient describes it as more of passive SI.  She denies plan or intent.  Patient cites her children as a reason she would never harm herself.  She is also future oriented and expresses hope for the future.  Patient does report that  she has had suicidal thoughts before.  Patient states in June she was thinking about "to slit my wrist".  She describes being in the the bathroom of her house while her children were there with her ex boyfriend.  She states that she made comments about doing it, but that her boyfriend stated he would call the police.  Patient's states that her boyfriend knew that she would not harm herself because of her children and the police were not called.  Patient describes making small superficial cuts on her wrists at this time.  Patient also describes that when she was in high school she took cocaine and Xanax and "something else" in an attempt to end her life.  Patient states that at this time in high school her friends thought she was "having fun" and did not think that she was attempting to end her life.  Patient states that when she took these illicit substances she was "up and out of my mind".  Patient states that she feels like she can be safe if discharged and would like to spend time with her children.  Patient provides consent for social work to speak with her father for safety planning.  See social work note for additional information-father did not express any safety concerns about her returning home.  Stay Summary:  Patient presented to the [facility] voluntarily on [facility]/26 after an argument with her father for assessment of depression,  sadness and hopelessness.  Patient expressed that she was having some suicidal thoughts and was admitted to the [facility] for further treatment and started on Lexapro 10 mg.  Shortly after admission patient was requesting to leave-see above for additional information.  Patient denied SI/HI/AVH and was able to contract for safety.  See social work note for additional information-Father expressed no safety concerns about patient returning home.  Patient did not meet criteria for IVC and was requesting discharge.  Patient was discharged.  Discussed walk-in hours at the [facility]  behavioral health center-patient was amenable to present for appointment for therapy and medication management.  Patient was provided with 30 days of Vistaril and Lexapro which were sent to pharmacy of choice.  Total Time spent with patient: 45 minutes  Past Psychiatric History: see H&P Past Medical History:  Past Medical History:  Diagnosis Date   Cholestasis of pregnancy 2017   Gestational diabetes    diet controlled   Infection    UTI    Past Surgical History:  Procedure Laterality Date   NO PAST SURGERIES     Family History:  Family History  Problem Relation Age of Onset   Hypertension Mother    Diabetes Mother    Hypertension Father    Diabetes Father    Family Psychiatric History: see H&P Social History:  Social History   Substance and Sexual Activity  Alcohol Use No     Social History   Substance and Sexual Activity  Drug Use Not Currently   Types: Marijuana   Comment: 2015    Social History   Socioeconomic History   Marital status: Single    Spouse name: Not on file   Number of children: Not on file   Years of education: Not on file   Highest education level: Not on file  Occupational History   Not on file  Tobacco Use   Smoking status: Never   Smokeless tobacco: Never  Vaping Use   Vaping Use: Never used  Substance and Sexual Activity   Alcohol use: No   Drug use: Not Currently    Types: Marijuana    Comment: 2015   Sexual activity: Yes    Birth control/protection: None  Other Topics Concern   Not on file  Social History Narrative   Not on file   Social Determinants of Health   Financial Resource Strain: Not on file  Food Insecurity: Not on file  Transportation Needs: Not on file  Physical Activity: Not on file  Stress: Not on file  Social Connections: Not on file   SDOH:  SDOH Screenings   Alcohol Screen: Not on file  Depression (PHQ2-9): Not on file  Financial Resource Strain: Not on file  Food Insecurity: Not on file   Housing: Not on file  Physical Activity: Not on file  Social Connections: Not on file  Stress: Not on file  Tobacco Use: Not on file  Transportation Needs: Not on file    Tobacco Cessation:  N/A, patient does not currently use tobacco products  Current Medications:  No current facility-administered medications for this encounter.   Current Outpatient Medications  Medication Sig Dispense Refill   aspirin-acetaminophen-caffeine (EXCEDRIN MIGRAINE) 250-250-65 MG tablet Take 2 tablets by mouth every 6 (six) hours as needed for migraine.     OVER THE COUNTER MEDICATION Take 20 mg by mouth at bedtime. Melatonin Gummies 10mg      escitalopram (LEXAPRO) 10 MG tablet Take 1 tablet (10 mg total) by mouth  daily. 30 tablet 0   hydrOXYzine (ATARAX/VISTARIL) 25 MG tablet Take 1 tablet (25 mg total) by mouth 3 (three) times daily as needed for anxiety. 30 tablet 0    PTA Medications: (Not in a hospital admission)   Musculoskeletal  Strength & Muscle Tone: within normal limits Gait & Station: normal Patient leans: N/A  Psychiatric Specialty Exam  Presentation  General Appearance: Appropriate for Environment; Casual  Eye Contact:Good  Speech:Clear and Coherent; Normal Rate  Speech Volume:Normal  Handedness:No data recorded  Mood and Affect  Mood:Euthymic  Affect:Appropriate; Congruent   Thought Process  Thought Processes:Coherent; Goal Directed; Linear  Descriptions of Associations:Intact  Orientation:Full (Time, Place and Person)  Thought Content:WDL; Logical     Hallucinations:Hallucinations: None  Ideas of Reference:None  Suicidal Thoughts:Suicidal Thoughts: No  Homicidal Thoughts:Homicidal Thoughts: No   Sensorium  Memory:Immediate Good; Recent Good; Remote Good  Judgment:Fair  Insight:Fair   Executive Functions  Concentration:Good  Attention Span:Good  Recall:Good  Fund of Knowledge:Good  Language:Good   Psychomotor Activity  Psychomotor  Activity:Psychomotor Activity: Normal   Assets  Assets:Communication Skills; Desire for Improvement; Housing; Physical Health; Resilience   Sleep  Sleep:Sleep: Fair  No data recorded  Physical Exam   Blood pressure 99/68, pulse 93, temperature 98.2 F (36.8 C), temperature source Oral, resp. rate 18, SpO2 100 %, unknown if currently breastfeeding. There is no height or weight on file to calculate BMI.  See SRA for physical Exam and ROS  See SRA for suicide risk assessment   Plan Of Care/Follow-up recommendations:  Activity:  as tolerated Diet:  regular Other:      Patient is instructed prior to discharge to: Take all medications as prescribed by his/her mental healthcare provider. Report any adverse effects and or reactions from the medicines to his/her outpatient provider promptly. Patient has been instructed & cautioned: To not engage in alcohol and or illegal drug use while on prescription medicines. In the event of worsening symptoms, patient is instructed to call the crisis hotline, 911 and or go to the nearest ED for appropriate evaluation and treatment of symptoms. To follow-up with his/her primary care provider for your other medical issues, concerns and or health care needs.   Allergies as of 11/26/2020         Reactions    Other Anaphylaxis    Peaches            Medication List       TAKE these medications     aspirin-acetaminophen-caffeine 250-250-65 MG tablet Commonly known as: EXCEDRIN MIGRAINE Take 2 tablets by mouth every 6 (six) hours as needed for migraine.    escitalopram 10 MG tablet Commonly known as: LEXAPRO Take 1 tablet (10 mg total) by mouth daily.    hydrOXYzine 25 MG tablet Commonly known as: ATARAX/VISTARIL Take 1 tablet (25 mg total) by mouth 3 (three) times daily as needed for anxiety.    OVER THE COUNTER MEDICATION Take 20 mg by mouth at bedtime. Melatonin Gummies 10mg          Patient provided with prescription for 30 days  for hydroxyzine and Lexapro.  Discussed walk-in hours at the [facility].  Patient expressed understanding and stated that she would present for walk-in appointment.    Disposition: self care  [facility], MD 11/27/2020, 8:29 AM

## 2020-11-26 NOTE — ED Provider Notes (Signed)
Suicide Risk Assessment  Admission Assessment    FBC Admission Suicide Risk Assessment   Patient presents to [facility] via police transport with active suicidal ideations. Patient presents here voluntarily and is not under IVC. Patient reports a long history of untreated depression symptoms. She reports over the last 4 months recent stressors including breaking up with her boyfriend ( who is the father of both of her small children) and strained relationship with her father since having to return back home to live since break up has escalated depression, sadness, and hopelessness. Since the break-up she is raising and financially supporting her two children alone. She endorses a highly dysfunctional relationship with her father. Over the last 4 months she has experienced fleeting thoughts of suicide. Over the last two weeks the thoughts of SI have become more concrete to include a plan of either stabbing herself or jumping from bridge. She denies HI. She is is not experiencing delusions or hallucinations. She has no known chronic medication conditions.  No prior behavioral health admission or treatment for depression. Denies experiencing any depression during or after either two pregnancies. She endorses occasional social weekend alcohol use although denies illicit drug use. Patient verbalizes need for help and voluntarily agrees to admission.   Demographic Factors:  Adolescent or young adult  Loss Factors: Financial problems/change in socioeconomic status  Historical Factors: Family history of mental illness or substance abuse  Risk Reduction Factors:   Sense of responsibility to family, Religious beliefs about death, and Living with another person, especially a relative  Continued Clinical Symptoms:  Depression/Anxiety  Cognitive Features That Contribute To Risk:  None    Suicide Risk:  Moderate:  Frequent suicidal ideation with limited intensity, and duration, some specificity in terms of  plans, no associated intent, good self-control, limited dysphoria/symptomatology, some risk factors present, and identifiable protective factors, including available and accessible social support.  Musculoskeletal: Strength & Muscle Tone: within normal limits Gait & Station: normal Patient leans: N/A  Psychiatric Specialty Exam:  Presentation  General Appearance: Appropriate for Environment; Fairly Groomed  Eye Contact:Good  Speech:Normal Rate  Speech Volume:Normal  Handedness:No data recorded  Mood and Affect  Mood:Depressed; Hopeless  Affect:Depressed; Tearful   Thought Process  Thought Processes:Coherent  Descriptions of Associations:Intact  Orientation:Full (Time, Place and Person)  Thought Content:WDL  History of Schizophrenia/Schizoaffective disorder:No data recorded Duration of Psychotic Symptoms:No data recorded Hallucinations:Hallucinations: None  Ideas of Reference:None  Suicidal Thoughts:Suicidal Thoughts: Yes, Active SI Active Intent and/or Plan: With Plan; Without Means to Carry Out  Homicidal Thoughts:Homicidal Thoughts: No   Sensorium  Memory:Immediate Good; Recent Good; Remote Good  Judgment:Impaired (Suicidal ideations)  Insight:Present   Executive Functions  Concentration:Good  Attention Span:Good  Recall:Good  Fund of Knowledge:Good  Language:Good   Psychomotor Activity  Psychomotor Activity:Psychomotor Activity: Normal   Assets  Assets:Communication Skills; Financial Resources/Insurance; Housing; Physical Health; Desire for Improvement   Sleep  Sleep:No data recorded   Physical Exam: Physical Exam Vitals reviewed.  Constitutional:      General: She is not in acute distress.    Appearance: She is not ill-appearing, toxic-appearing or diaphoretic.  HENT:     Right Ear: External ear normal.     Left Ear: External ear normal.  Eyes:     Pupils: Pupils are equal, round, and reactive to light.  Cardiovascular:      Rate and Rhythm: Normal rate.  Pulmonary:     Effort: Pulmonary effort is normal. No respiratory distress.  Musculoskeletal:  General: Normal range of motion.  Neurological:     General: No focal deficit present.     Mental Status: She is alert and oriented to person, place, and time.  Psychiatric:        Behavior: Behavior is cooperative.        Thought Content: Thought content is not delusional. Thought content includes suicidal ideation. Thought content does not include homicidal ideation. Thought content includes suicidal plan.   Review of Systems  Constitutional:  Negative for chills, diaphoresis, fever, malaise/fatigue and weight loss.  HENT:  Negative for congestion.   Respiratory:  Negative for cough and shortness of breath.   Cardiovascular:  Negative for chest pain and palpitations.  Gastrointestinal:  Negative for diarrhea, nausea and vomiting.  Neurological:  Negative for dizziness and seizures.  Psychiatric/Behavioral:  Positive for depression and suicidal ideas. Negative for hallucinations and memory loss. The patient is nervous/anxious and has insomnia.   All other systems reviewed and are negative.  Blood pressure 99/68, pulse 93, temperature 98.2 F (36.8 C), temperature source Oral, resp. rate 18, SpO2 100 %, unknown if currently breastfeeding. There is no height or weight on file to calculate BMI.    I certify that [facility] services furnished can reasonably be expected to improve the patient's condition.   Jackelyn Poling, NP 11/26/2020, 8:03 AM

## 2020-11-26 NOTE — ED Notes (Signed)
Pt transferred to [facility]. Report given to Olegario Messier, Charity fundraiser. Safety maintained.

## 2020-11-26 NOTE — Clinical Social Work Psych Note (Addendum)
CSW Initial/Discharge Note   CSW met with Colleen Rose for introduction and to begin discussions regarding treatment and possible discharge planning.   Colleen Rose shared that she originally presented to the [facility], seeking services for ongoing depressive symptoms, including suicidal ideation. Colleen Rose shared that she and her children's father has a "bad breakup" back in June of this year, and that she has struggled with depression and anxiety for many months since their separation.   Colleen Rose reports she moved in with her family following her break-up. Colleen Rose reports her family is supportive of her and her two children, ages 23yo & 2yo, however she does report having a strained relationship because "we both get angry and just go at each other". Although Colleen Rose described a strained relationship with her father, she does reports she feel safe to return at discharge.   Colleen Rose denied having any SI, HI or AVH at this time. Colleen Rose requested to be discharged as soon as possible.   Colleen Rose provided consent for CSW to contact her father. She shared that her father does not speak [place].   - [employer] [place] [phone] assisted with Spanish-Speaking interpreting services.   CSW contacted the patient's father, Colleen Rose [phone]) for collateral contact regarding safety planning. Colleen Rose reports that he and his daughter argue a lot, however he is supportive of her and "wants what is best". Colleen Rose shared that he does not have any safety concerns regarding Colleen Rose returning home. He did describe that Colleen Rose has an "aggressive temperament".   Colleen Rose reports he does not have any issues with the patient to return home.    Colleen Rose was agreeable with following up with [facility] for outpatient psychiatric services for medication management and therapy.   Colleen Rose will discharge home today.    Colleen Rose, MSW, LCSW Clinical Education officer, museum ([place]) [facility]

## 2020-11-26 NOTE — ED Notes (Signed)
Pt A&O x4, presents with SI. Pt denies HI/AVH. Pt is able to contract for safety. Pt cooperative and calm. Skin search completed. Pt ambulated independently to unit. Oriented to unit/staff. No signs of acute distress noted. Will continue to monitor for safety.

## 2021-01-15 ENCOUNTER — Encounter (HOSPITAL_COMMUNITY): Payer: Self-pay | Admitting: Emergency Medicine

## 2021-01-15 ENCOUNTER — Ambulatory Visit (HOSPITAL_COMMUNITY)
Admission: EM | Admit: 2021-01-15 | Discharge: 2021-01-15 | Disposition: A | Discharge status: Home / Self Care | Payer: Self-pay | Attending: Urgent Care | Admitting: Urgent Care

## 2021-01-15 ENCOUNTER — Other Ambulatory Visit: Payer: Self-pay

## 2021-01-15 DIAGNOSIS — J101 Influenza due to other identified influenza virus with other respiratory manifestations: Secondary | ICD-10-CM

## 2021-01-15 LAB — POC INFLUENZA A AND B ANTIGEN (URGENT CARE ONLY)
INFLUENZA A ANTIGEN, POC: POSITIVE — AB
INFLUENZA B ANTIGEN, POC: NEGATIVE

## 2021-01-15 MED ORDER — PROMETHAZINE-DM 6.25-15 MG/5ML PO SYRP: 5.0000 mL | ORAL_SOLUTION | Freq: Every evening | ORAL | 0 refills | Status: AC | PRN

## 2021-01-15 MED ORDER — OSELTAMIVIR PHOSPHATE 75 MG PO CAPS: 75.0000 mg | ORAL_CAPSULE | Freq: Two times a day (BID) | ORAL | 0 refills | Status: AC

## 2021-01-15 MED ORDER — BENZONATATE 100 MG PO CAPS: 100.0000 mg | ORAL_CAPSULE | Freq: Three times a day (TID) | ORAL | 0 refills | Status: AC | PRN

## 2021-01-15 NOTE — ED Triage Notes (Signed)
Pt presents with cough, congestion, headache, body aches, and chills that started yesterday.

## 2021-01-15 NOTE — Discharge Instructions (Signed)
We will manage this as a viral illness as you tested positive for influenza, will need to start Tamiflu. For sore throat or cough try using a honey-based tea. Use 3 teaspoons of honey with juice squeezed from half lemon. Place shaved pieces of ginger into 1/2-1 cup of water and warm over stove top. Then mix the ingredients and repeat every 4 hours as needed. Please take ibuprofen 600mg  every 6 hours with food alternating with OR taken together with Tylenol 500mg -650mg  every 6 hours for throat pain, fevers, aches and pains. Hydrate very well with at least 2 liters of water. Eat light meals such as soups (chicken and noodles, vegetable, chicken and wild rice).  Do not eat foods that you are allergic to.  Taking an antihistamine like Zyrtec can help against postnasal drainage, sinus congestion.  You can take this together with pseudoephedrine (Sudafed) at a dose of 30-60 mg 3 times a day or twice daily as needed for the same kind of nasal drip, congestion.  However, limit your use of pseudoephedrine if you have high blood pressure or avoid altogether if you have abnormal heart rhythms, heart condition.

## 2021-01-15 NOTE — ED Provider Notes (Signed)
[facility]   MRN: [id number] DOB: 1995/11/08  Subjective:   Colleen Rose is a 25 y.o. female presenting for 1 day history of acute onset body aches, coughing, congestion, sinus headaches, chills, malaise and fatigue.  Has had multiple sick contacts both at work and at home.  No chest pain, shortness of breath or wheezing.  No history of respiratory disorders.  Patient is not a smoker.  No current facility-administered medications for this encounter.  Current Outpatient Medications:    aspirin-acetaminophen-caffeine (EXCEDRIN MIGRAINE) 250-250-65 MG tablet, Take 2 tablets by mouth every 6 (six) hours as needed for migraine., Disp: , Rfl:    escitalopram (LEXAPRO) 10 MG tablet, Take 1 tablet (10 mg total) by mouth daily., Disp: 30 tablet, Rfl: 0   hydrOXYzine (ATARAX/VISTARIL) 25 MG tablet, Take 1 tablet (25 mg total) by mouth 3 (three) times daily as needed for anxiety., Disp: 30 tablet, Rfl: 0   OVER THE COUNTER MEDICATION, Take 20 mg by mouth at bedtime. Melatonin Gummies 10mg , Disp: , Rfl:    Allergies  Allergen Reactions   Other Anaphylaxis    Peaches    Past Medical History:  Diagnosis Date   Cholestasis of pregnancy 2017   Gestational diabetes    diet controlled   Infection    UTI     Past Surgical History:  Procedure Laterality Date   NO PAST SURGERIES      Family History  Problem Relation Age of Onset   Hypertension Mother    Diabetes Mother    Hypertension Father    Diabetes Father     Social History   Tobacco Use   Smoking status: Never   Smokeless tobacco: Never  Vaping Use   Vaping Use: Never used  Substance Use Topics   Alcohol use: No   Drug use: Not Currently    Types: Marijuana    Comment: 2015    ROS   Objective:   Vitals: There were no vitals taken for this visit.  Physical Exam Constitutional:      General: She is not in acute distress.    Appearance: Normal appearance. She is well-developed. She  is not ill-appearing, toxic-appearing or diaphoretic.  HENT:     Head: Normocephalic and atraumatic.     Right Ear: Tympanic membrane, ear canal and external ear normal. No drainage or tenderness. No middle ear effusion. Tympanic membrane is not erythematous.     Left Ear: Tympanic membrane, ear canal and external ear normal. No drainage or tenderness.  No middle ear effusion. Tympanic membrane is not erythematous.     Nose: Congestion present. No rhinorrhea.     Mouth/Throat:     Mouth: Mucous membranes are moist. No oral lesions.     Pharynx: No pharyngeal swelling, oropharyngeal exudate, posterior oropharyngeal erythema or uvula swelling.     Tonsils: No tonsillar exudate or tonsillar abscesses.  Eyes:     Extraocular Movements: Extraocular movements intact.     Right eye: Normal extraocular motion.     Left eye: Normal extraocular motion.     Conjunctiva/sclera: Conjunctivae normal.     Pupils: Pupils are equal, round, and reactive to light.  Cardiovascular:     Rate and Rhythm: Normal rate and regular rhythm.     Pulses: Normal pulses.     Heart sounds: Normal heart sounds. No murmur heard.   No friction rub. No gallop.  Pulmonary:     Effort: Pulmonary effort is normal. No respiratory  distress.     Breath sounds: Normal breath sounds. No stridor. No wheezing, rhonchi or rales.  Musculoskeletal:     Cervical back: Normal range of motion and neck supple.  Lymphadenopathy:     Cervical: No cervical adenopathy.  Skin:    General: Skin is warm and dry.     Findings: No rash.  Neurological:     General: No focal deficit present.     Mental Status: She is alert and oriented to person, place, and time.  Psychiatric:        Mood and Affect: Mood normal.        Behavior: Behavior normal.        Thought Content: Thought content normal.    Results for orders placed or performed during the hospital encounter of 01/15/21 (from the past 24 hour(s))  POC Influenza A & B Ag (Urgent  Care)     Status: Abnormal   Collection Time: 01/15/21  3:32 PM  Result Value Ref Range   INFLUENZA A ANTIGEN, POC POSITIVE (A) NEGATIVE   INFLUENZA B ANTIGEN, POC NEGATIVE NEGATIVE    Assessment and Plan :   PDMP not reviewed this encounter.  1. Influenza A     Deferred imaging given clear cardiopulmonary exam, hemodynamically stable vital signs. Will cover for influenza with Tamiflu given + results.  Use supportive care, rest, fluids, hydration, light meals, schedule Tylenol and ibuprofen. Counseled patient on potential for adverse effects with medications prescribed today, patient verbalized understanding. ER and return-to-clinic precautions discussed, patient verbalized understanding.   Wallis Bamberg, PA-C 01/15/21 1536
